# Patient Record
Sex: Male | Born: 1990 | Race: Black or African American | Hispanic: No | Marital: Single | Smoking: Never smoker
Health system: Southern US, Community
[De-identification: ages and names within clinical notes are randomized; demographics above are authoritative.]

## PROBLEM LIST (undated history)

## (undated) DIAGNOSIS — J45909 Unspecified asthma, uncomplicated: Secondary | ICD-10-CM

## (undated) DIAGNOSIS — B019 Varicella without complication: Secondary | ICD-10-CM

## (undated) HISTORY — PX: ANKLE SURGERY: SHX546

---
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
## 2010-02-22 NOTE — Progress Notes (Signed)
Summary: triage  Phone Note Call from Patient Call back at [phone]   Caller: mom-Lucinda Patin Summary of Call: pain when he opens his mouth on the right side to his ear  ask for Ms Brandel the [employer] Teacher Initial call taken by: De Nurse,  January 23, 2009 12:11 PM  Follow-up for Phone Call        left message Follow-up by: Golden Circle RN,  January 23, 2009 2:01 PM  Additional Follow-up for Phone Call Additional follow up Details #1::        spoke with mom. this has been happening x 1 wk. appt today at 3:30 with Dr. Clotilde Dieter. aware his pcp is not here today Additional Follow-up by: Golden Circle RN,  January 24, 2009 10:01 AM

## 2010-02-22 NOTE — Assessment & Plan Note (Signed)
Summary: ear pain/jaw pain   Vital Signs:  Patient profile:   20 year old male Height:      68.5 inches Weight:      158 pounds BMI:     23.76 BSA:     1.86 Temp:     99.0 degrees F Pulse rate:   51 / minute BP sitting:   127 / 79  Vitals Entered By: Jone Baseman CMA (January 24, 2009 4:03 PM) CC: right ear and jaw pain x 1 week Is Patient Diabetic? No Pain Assessment Patient in pain? yes     Location: right ear and jaw Intensity: 8 Type: sharp,shooting   Primary Care Provider:  Romero Belling MD  CC:  right ear and jaw pain x 1 week.  History of Present Illness: Pt has had Rt jaw and ear pain for 1 week. He has not had any loss of hearing. He can open his mouth but has pain with chewing hard things. He says it is a shocking and stabbing pain. He does not have a jaw click. He is not having a fever or any other cold symtpons.   Habits & Providers  Alcohol-Tobacco-Diet     Tobacco Status: never  Current Medications (verified): 1)  Cortisporin 3.5-10000-1 Soln (Neomycin-Polymyxin-Hc) .... 4 Drops in Rt Ear 3-4 Times A Day For 10 Days.  Lie With Affected Ear Up For 5 Min After Administration Give One Large Bottle 2)  Ibuprofen 600 Mg Tabs (Ibuprofen) .... Take One Pill Every 6-8 Hours For Jaw Pain.  Review of Systems        vitals reviewed and pertinent negatives and positives seen in HPI   Physical Exam  General:  Well-developed,well-nourished,in no acute distress; alert,appropriate and cooperative throughout examination Head:  Normocephalic and atraumatic without obvious abnormalities. No apparent alopecia or balding. Ears:  Rt ear canal is erythematous, Lt ear canal is obscured by cerumen.  Mouth:  Oral mucosa and oropharynx without lesions or exudates.  Teeth in good repair. Some tenderness over Rt jaw when opening.  Neck:  No deformities, masses, or tenderness noted. Msk:  Rt TMJ no joint swelling, no joint warmth, and joint tenderness.     Impression &  Recommendations:  Problem # 1:  JAW PAIN (ICD-526.9) Assessment New Pt likely has Rt otits externa and pt was given Rx for Corticsporin Otic and Ibuprofen 600mg . He was instructed to see his dentist if his jaw pain does not improve in 2 weeks.   Orders: [facility]- Est Level  3 (09811)  Complete Medication List: 1)  Cortisporin 3.5-10000-1 Soln (Neomycin-polymyxin-hc) .... 4 drops in rt ear 3-4 times a day for 10 days.  lie with affected ear up for 5 min after administration give one large bottle 2)  Ibuprofen 600 Mg Tabs (Ibuprofen) .... Take one pill every 6-8 hours for jaw pain.  Patient Instructions: 1)  It was nice to see you today. 2)  Use this antibiotic for 10 days and use the Ibuprofen as well. If you are still having pain in 2 weeks go to see your dentist.  Prescriptions: IBUPROFEN 600 MG TABS (IBUPROFEN) take one pill every 6-8 hours for jaw pain.  #12- x 0   Entered and Authorized by:   Jamie Brookes MD   Signed by:   Jamie Brookes MD on 01/24/2009   Method used:   Print then Give to Patient   RxID:   [id number] CORTISPORIN 3.5-10000-1 SOLN (NEOMYCIN-POLYMYXIN-HC) 4 drops in Rt ear 3-4 times a  day for 10 days.  lie with affected ear up for 5 min after administration Give one large bottle  #1 x 1   Entered and Authorized by:   Jamie Brookes MD   Signed by:   Jamie Brookes MD on 01/24/2009   Method used:   Print then Give to Patient   RxID:   [id number]

## 2012-01-28 ENCOUNTER — Emergency Department (HOSPITAL_BASED_OUTPATIENT_CLINIC_OR_DEPARTMENT_OTHER)
Admission: EM | Admit: 2012-01-28 | Discharge: 2012-01-29 | Disposition: A | Discharge status: Home / Self Care | Payer: BC Managed Care – PPO | Attending: Emergency Medicine | Admitting: Emergency Medicine

## 2012-01-28 ENCOUNTER — Encounter (HOSPITAL_BASED_OUTPATIENT_CLINIC_OR_DEPARTMENT_OTHER): Payer: Self-pay | Admitting: *Deleted

## 2012-01-28 DIAGNOSIS — Y93I9 Activity, other involving external motion: Secondary | ICD-10-CM | POA: Insufficient documentation

## 2012-01-28 DIAGNOSIS — F17200 Nicotine dependence, unspecified, uncomplicated: Secondary | ICD-10-CM | POA: Insufficient documentation

## 2012-01-28 DIAGNOSIS — Y92410 Unspecified street and highway as the place of occurrence of the external cause: Secondary | ICD-10-CM | POA: Insufficient documentation

## 2012-01-28 DIAGNOSIS — M5430 Sciatica, unspecified side: Secondary | ICD-10-CM | POA: Insufficient documentation
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
## 2012-01-29 MED ORDER — HYDROCODONE-ACETAMINOPHEN 5-325 MG PO TABS
ORAL_TABLET | ORAL | Status: AC
  Administered 2012-01-29: 01:00:00
  Filled 2012-01-29: qty 1

## 2012-01-29 NOTE — ED Provider Notes (Signed)
See Epic downtime documentation.  Hanley Seamen, MD 01/29/12 [phone]
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
## 2012-10-23 ENCOUNTER — Encounter (HOSPITAL_BASED_OUTPATIENT_CLINIC_OR_DEPARTMENT_OTHER): Payer: Self-pay | Admitting: Student

## 2012-10-23 ENCOUNTER — Emergency Department (HOSPITAL_BASED_OUTPATIENT_CLINIC_OR_DEPARTMENT_OTHER)
Admission: EM | Admit: 2012-10-23 | Discharge: 2012-10-23 | Disposition: A | Discharge status: Home / Self Care | Payer: BC Managed Care – PPO | Attending: Emergency Medicine | Admitting: Emergency Medicine

## 2012-10-23 DIAGNOSIS — F17200 Nicotine dependence, unspecified, uncomplicated: Secondary | ICD-10-CM | POA: Insufficient documentation

## 2012-10-23 DIAGNOSIS — R079 Chest pain, unspecified: Secondary | ICD-10-CM

## 2012-10-23 DIAGNOSIS — R072 Precordial pain: Secondary | ICD-10-CM | POA: Insufficient documentation

## 2012-10-23 DIAGNOSIS — R55 Syncope and collapse: Secondary | ICD-10-CM | POA: Insufficient documentation

## 2012-10-23 LAB — CBC
HCT: 41.6 % (ref 39.0–52.0)
Platelets: 193 10*3/uL (ref 150–400)
RBC: 4.99 MIL/uL (ref 4.22–5.81)
RDW: 13.4 % (ref 11.5–15.5)
WBC: 3.9 10*3/uL — ABNORMAL LOW (ref 4.0–10.5)

## 2012-10-23 LAB — BASIC METABOLIC PANEL
BUN: 10 mg/dL (ref 6–23)
CO2: 28 mEq/L (ref 19–32)
Chloride: 102 mEq/L (ref 96–112)
Creatinine, Ser: 1 mg/dL (ref 0.50–1.35)
GFR calc Af Amer: >90 mL/min (ref >90)
GFR calc non Af Amer: >90 mL/min (ref >90)
Sodium: 138 mEq/L (ref 135–145)

## 2012-10-23 LAB — D-DIMER, QUANTITATIVE: D-Dimer, Quant: <0.27 ug/mL-FEU (ref 0.00–0.48)

## 2012-10-23 LAB — TROPONIN I: Troponin I: <0.3 ng/mL (ref <0.30)

## 2012-10-23 LAB — GLUCOSE, CAPILLARY: Glucose-Capillary: 93 mg/dL (ref 70–99)

## 2012-10-23 MED ORDER — IBUPROFEN 800 MG PO TABS: 800.0000 mg | ORAL_TABLET | Freq: Three times a day (TID) | ORAL | Status: DC

## 2012-10-23 NOTE — ED Provider Notes (Signed)
CSN: [id number]     Arrival date & time 10/23/12  1146 History   First MD Initiated Contact with Patient 10/23/12 1242     Chief Complaint  Patient presents with  . Chest Pain  . Near Syncope   (Consider location/radiation/quality/duration/timing/severity/associated sxs/prior Treatment) Patient is a 22 y.o. male presenting with chest pain. The history is provided by the patient.  Chest Pain Pain location:  Substernal area Pain quality: pressure   Pain quality: not aching, not burning, not dull and not hot   Pain radiates to:  Does not radiate Pain radiates to the back: no   Pain severity:  Moderate Onset quality:  Sudden Progression:  Worsening Chronicity:  New Context: not breathing   Context comment:  Walking Relieved by:  Nothing Worsened by:  Nothing tried Ineffective treatments:  None tried Associated symptoms: syncope   Associated symptoms: no shortness of breath, not vomiting and no weakness   Associated symptoms comment:  Syncope  Risk factors: male sex and smoking   Risk factors: no aortic disease, no birth control, no coronary artery disease, no diabetes mellitus, no high cholesterol, no immobilization, no Marfan's syndrome, not obese and no prior DVT/PE     History reviewed. No pertinent past medical history. Past Surgical History  Procedure Laterality Date  . Ankle surgery     History reviewed. No pertinent family history. History  Substance Use Topics  . Smoking status: Current Some Day Smoker  . Smokeless tobacco: Not on file  . Alcohol Use: No    Review of Systems  Respiratory: Negative for shortness of breath.   Cardiovascular: Positive for chest pain and syncope.  Gastrointestinal: Negative for vomiting.  Neurological: Negative for weakness.  All other systems reviewed and are negative.    Allergies  Review of patient's allergies indicates no known allergies.  Home Medications   Current Outpatient Rx  Name  Route  Sig  Dispense  Refill  .  ibuprofen (ADVIL,MOTRIN) 600 MG tablet      Take one pill every 6-8 hours for jaw pain          . NEOMYCIN-POLYMYXIN-HC, OTIC, (CORTISPORIN) 1 % SOLN      Use 4 drops in right ear 3-4 times a day for 10 days. Lie with affected ear up for 5 minutes after administration. Dispense one large bottle           BP 163/94  Pulse 87  Temp(Src) 98.2 F (36.8 C) (Oral)  Resp 20  Wt 160 lb (72.576 kg)  BMI 23.97 kg/m2  SpO2 100% Physical Exam  Nursing note and vitals reviewed. Constitutional: He is oriented to person, place, and time. He appears well-developed and well-nourished. No distress.  HENT:  Head: Normocephalic and atraumatic.  Mouth/Throat: No oropharyngeal exudate.  Eyes: EOM are normal. Pupils are equal, round, and reactive to light.  Neck: Normal range of motion. Neck supple.  Cardiovascular: Normal rate and regular rhythm.  Exam reveals no friction rub.   No murmur heard. Pulmonary/Chest: Effort normal and breath sounds normal. No respiratory distress. He has no wheezes. He has no rales.  Abdominal: He exhibits no distension. There is no tenderness. There is no rebound.  Musculoskeletal: Normal range of motion. He exhibits no edema.  Neurological: He is alert and oriented to person, place, and time.  Skin: He is not diaphoretic.    ED Course  Procedures (including critical care time) Labs Review Labs Reviewed  CBC - Abnormal; Notable for the following:  WBC 3.9 (*)    All other components within normal limits  GLUCOSE, CAPILLARY  BASIC METABOLIC PANEL  TROPONIN I  D-DIMER, QUANTITATIVE  TROPONIN I   Imaging Review No results found.   Date: 10/23/2012  Rate: 82  Rhythm: normal sinus rhythm  QRS Axis: normal  Intervals: normal  ST/T Wave abnormalities: normal  Conduction Disutrbances:none  Narrative Interpretation:   Old EKG Reviewed: unchanged    MDM   1. Syncope   2. Chest pain    all chocolate 37M with no prior history presents with syncopal  episode. Also concerned for trembling in his hands that has been going on for 6 months, he thinks it's related to his tramadol (takes for chronic back pain). Stated today had syncopal episode, woke up on the ground with his neighbor waking him up. Prior to the episode has chest pain, SOB. No prior CP or SOB. No family history of early cardiac disease or sudden cardiac death.  Here EKG normal. Exam normal. Will check delta troponin and d-dimer.\ Dimer negative. Troponins negative Will have patient stop tramadol and take motrin in case this is secondary to possible seizure, no seizure activity reported, but tramadol can precipitate seizures. Instructed to f/u with Cards with syncopal episode with preceding chest pain.     Dagmar Hait, MD 10/23/12 [phone]

## 2012-10-23 NOTE — ED Notes (Signed)
MD at bedside. 

## 2012-10-23 NOTE — ED Notes (Signed)
Pt in with c/o trembling in hands and central chest pain today, report neighbor woke him up and reports syncopal incidents intermittently x 6 month

## 2013-02-10 ENCOUNTER — Emergency Department (HOSPITAL_COMMUNITY)
Admission: EM | Admit: 2013-02-10 | Discharge: 2013-02-10 | Disposition: A | Discharge status: Home / Self Care | Payer: BC Managed Care – PPO | Attending: Emergency Medicine | Admitting: Emergency Medicine

## 2013-02-10 ENCOUNTER — Encounter (HOSPITAL_COMMUNITY): Payer: Self-pay | Admitting: Emergency Medicine

## 2013-02-10 DIAGNOSIS — Y929 Unspecified place or not applicable: Secondary | ICD-10-CM | POA: Insufficient documentation

## 2013-02-10 DIAGNOSIS — Z791 Long term (current) use of non-steroidal anti-inflammatories (NSAID): Secondary | ICD-10-CM | POA: Insufficient documentation

## 2013-02-10 DIAGNOSIS — W261XXA Contact with sword or dagger, initial encounter: Secondary | ICD-10-CM

## 2013-02-10 DIAGNOSIS — W260XXA Contact with knife, initial encounter: Secondary | ICD-10-CM | POA: Insufficient documentation

## 2013-02-10 DIAGNOSIS — Y9389 Activity, other specified: Secondary | ICD-10-CM | POA: Insufficient documentation

## 2013-02-10 DIAGNOSIS — S61219A Laceration without foreign body of unspecified finger without damage to nail, initial encounter: Secondary | ICD-10-CM

## 2013-02-10 DIAGNOSIS — S61209A Unspecified open wound of unspecified finger without damage to nail, initial encounter: Secondary | ICD-10-CM | POA: Insufficient documentation

## 2013-02-10 DIAGNOSIS — F17200 Nicotine dependence, unspecified, uncomplicated: Secondary | ICD-10-CM | POA: Insufficient documentation

## 2013-02-10 NOTE — Discharge Instructions (Signed)
Keep your finger elevated. Ice several times a day. Keep clean. See information below.   Tissue Adhesive Wound Care Some cuts, wounds, lacerations, and incisions can be repaired by using tissue adhesive. Tissue adhesive is like glue. It holds the skin together, allowing for faster healing. It forms a strong bond on the skin in about 1 minute and reaches its full strength in about 2 or 3 minutes. The adhesive disappears naturally while the wound is healing. It is important to take proper care of your wound at home while it heals.  HOME CARE INSTRUCTIONS   Showers are allowed. Do not soak the area containing the tissue adhesive. Do not take baths, swim, or use hot tubs. Do not use any soaps or ointments on the wound. Certain ointments can weaken the glue.  If a bandage (dressing) has been applied, follow your health care provider's instructions for how often to change the dressing.   Keep the dressing dry if one has been applied.   Do not scratch, pick, or rub the adhesive.   Do not place tape over the adhesive. The adhesive could come off when pulling the tape off.   Protect the wound from further injury until it is healed.   Protect the wound from sun and tanning bed exposure while it is healing and for several weeks after healing.   Only take over-the-counter or prescription medicines as directed by your health care provider.   Keep all follow-up appointments as directed by your health care provider. SEEK IMMEDIATE MEDICAL CARE IF:   Your wound becomes red, swollen, hot, or tender.   You develop a rash after the glue is applied.  You have increasing pain in the wound.   You have a red streak that goes away from the wound.   You have pus coming from the wound.   You have increased bleeding.  You have a fever.  You have shaking chills.   You notice a bad smell coming from the wound.   Your wound or adhesive breaks open.  MAKE SURE YOU:   Understand these  instructions.  Will watch your condition.  Will get help right away if you are not doing well or get worse. Document Released: 07/03/2000 Document Revised: 10/28/2012 Document Reviewed: 07/29/2012 [facility] LLPExit[facility] Patient Information 2014 [employer], [place].

## 2013-02-10 NOTE — ED Notes (Signed)
Pt was cutting chicken and has half inch LAC to left index finger. Bleeding controlled

## 2013-02-10 NOTE — ED Provider Notes (Signed)
CSN: [id number]     Arrival date & time 02/10/13  2107 History   None    This chart was scribed for non-physician practitioner, Jaynie Crumble, PA-C, working with Leonette Most B. Bernette Mayers, MD by Arlan Organ, ED Scribe. This patient was seen in room TR09C/TR09C and the patient's care was started at 11:05 PM.   Chief Complaint  Patient presents with  . Laceration   The history is provided by the patient. No language interpreter was used.    HPI Comments: Billy Parrish is a 23 y.o. male who presents to the Emergency Department complaining of a laceration to the left 2nd digit sustained after cutting some chicken with a sharp knife today around 8:30 PM. He reports perfused bleeding at the time of onset. He states he has noted numbness to the tip of his left index finger, but denies any other pain or discomfort. He denies any aggravating or alleviating factors. Denies fever or chills. He states his tetanus is UTD. Pt has no other pertinent medical history, and denies any other complaints at this time.  History reviewed. No pertinent past medical history. Past Surgical History  Procedure Laterality Date  . Ankle surgery     History reviewed. No pertinent family history. History  Substance Use Topics  . Smoking status: Current Some Day Smoker  . Smokeless tobacco: Never Used  . Alcohol Use: No    Review of Systems  Constitutional: Negative for fever and chills.  Skin: Positive for wound (laceration to left index finger).    Allergies  Review of patient's allergies indicates no known allergies.  Home Medications   Current Outpatient Rx  Name  Route  Sig  Dispense  Refill  . ibuprofen (ADVIL,MOTRIN) 600 MG tablet      Take one pill every 6-8 hours for jaw pain          . ibuprofen (ADVIL,MOTRIN) 800 MG tablet   Oral   Take 1 tablet (800 mg total) by mouth 3 (three) times daily.   21 tablet   0   . NEOMYCIN-POLYMYXIN-HC, OTIC, (CORTISPORIN) 1 % SOLN      Use 4 drops in  right ear 3-4 times a day for 10 days. Lie with affected ear up for 5 minutes after administration. Dispense one large bottle           Triage Vitals: BP 153/100  Pulse 87  Temp(Src) 98.8 F (37.1 C) (Oral)  Resp 18  Physical Exam  Nursing note and vitals reviewed. Constitutional: He is oriented to person, place, and time. He appears well-developed and well-nourished.  HENT:  Head: Normocephalic and atraumatic.  Eyes: EOM are normal.  Neck: Normal range of motion.  Cardiovascular: Normal rate and regular rhythm.   Pulmonary/Chest: Effort normal.  Musculoskeletal: Normal range of motion.  Neurological: He is alert and oriented to person, place, and time.  Skin: Skin is warm and dry.  2 cm hemastatic flap laceration to the tip of the left index finger  Psychiatric: He has a normal mood and affect. His behavior is normal.    ED Course  Procedures (including critical care time)    COORDINATION OF CARE: 11:05 PM- Will clean the area. Will use Derma Bond to close wound. Discussed treatment plan with pt at bedside and pt agreed to plan.     Labs Review Labs Reviewed - No data to display Imaging Review No results found.  EKG Interpretation   None       MDM  1. Laceration of finger     Patient is a very small laceration to the tip of the finger. It is a flap laceration does not appear to be deep. Patient's finger was irrigated with saline. I do not think he needs stitches. Wound repaired with Dermabond. Patient tolerated procedure well. Last tetanus shot was 2 years ago. Patient will go home with ibuprofen for pain, careful wound care, followup as needed.  I personally performed the services described in this documentation, which was scribed in my presence. The recorded information has been reviewed and is accurate.   Lottie Musselatyana A Krisinda Giovanni, PA-C 02/10/13 2345

## 2013-02-11 NOTE — ED Provider Notes (Signed)
Medical screening examination/treatment/procedure(s) were performed by non-physician practitioner and as supervising physician I was immediately available for consultation/collaboration.  EKG Interpretation   None         Shawanda Sievert B. Bernette MayersSheldon, MD 02/11/13 [phone]

## 2013-11-24 ENCOUNTER — Emergency Department (HOSPITAL_COMMUNITY)
Admission: EM | Admit: 2013-11-24 | Discharge: 2013-11-24 | Disposition: A | Discharge status: Home / Self Care | Payer: BC Managed Care – PPO | Source: Home / Self Care | Attending: Emergency Medicine | Admitting: Emergency Medicine

## 2013-11-24 ENCOUNTER — Ambulatory Visit (HOSPITAL_COMMUNITY): Payer: BC Managed Care – PPO | Attending: Emergency Medicine

## 2013-11-24 ENCOUNTER — Encounter (HOSPITAL_COMMUNITY): Payer: Self-pay

## 2013-11-24 DIAGNOSIS — Z720 Tobacco use: Secondary | ICD-10-CM | POA: Insufficient documentation

## 2013-11-24 DIAGNOSIS — R05 Cough: Secondary | ICD-10-CM | POA: Insufficient documentation

## 2013-11-24 DIAGNOSIS — J111 Influenza due to unidentified influenza virus with other respiratory manifestations: Secondary | ICD-10-CM

## 2013-11-24 DIAGNOSIS — R0602 Shortness of breath: Secondary | ICD-10-CM | POA: Insufficient documentation

## 2013-11-24 DIAGNOSIS — R69 Illness, unspecified: Principal | ICD-10-CM

## 2013-11-24 DIAGNOSIS — R059 Cough, unspecified: Secondary | ICD-10-CM

## 2013-11-24 LAB — POCT RAPID STREP A: Streptococcus, Group A Screen (Direct): NEGATIVE

## 2013-11-24 MED ORDER — ALBUTEROL SULFATE (2.5 MG/3ML) 0.083% IN NEBU
2.5000 mg | INHALATION_SOLUTION | Freq: Once | RESPIRATORY_TRACT | Status: AC
  Administered 2013-11-24: 2.5 mg via RESPIRATORY_TRACT

## 2013-11-24 MED ORDER — IPRATROPIUM BROMIDE 0.02 % IN SOLN
RESPIRATORY_TRACT | Status: AC
  Filled 2013-11-24: qty 2.5

## 2013-11-24 MED ORDER — IPRATROPIUM-ALBUTEROL 0.5-2.5 (3) MG/3ML IN SOLN
3.0000 mL | Freq: Once | RESPIRATORY_TRACT | Status: AC
  Administered 2013-11-24: 3 mL via RESPIRATORY_TRACT

## 2013-11-24 MED ORDER — PREDNISONE 20 MG PO TABS
ORAL_TABLET | ORAL | Status: AC
  Filled 2013-11-24: qty 3

## 2013-11-24 MED ORDER — ALBUTEROL SULFATE (2.5 MG/3ML) 0.083% IN NEBU
INHALATION_SOLUTION | RESPIRATORY_TRACT | Status: AC
  Filled 2013-11-24: qty 3

## 2013-11-24 MED ORDER — ALBUTEROL SULFATE HFA 108 (90 BASE) MCG/ACT IN AERS: 2.0000 | INHALATION_SPRAY | RESPIRATORY_TRACT | Status: DC | PRN

## 2013-11-24 MED ORDER — OSELTAMIVIR PHOSPHATE 75 MG PO CAPS: 75.0000 mg | ORAL_CAPSULE | Freq: Two times a day (BID) | ORAL | Status: DC

## 2013-11-24 MED ORDER — HYDROCODONE-ACETAMINOPHEN 5-325 MG PO TABS: ORAL_TABLET | ORAL | Status: DC

## 2013-11-24 MED ORDER — PREDNISONE 20 MG PO TABS: ORAL_TABLET | ORAL | Status: DC

## 2013-11-24 MED ORDER — PREDNISONE 20 MG PO TABS
60.0000 mg | ORAL_TABLET | Freq: Once | ORAL | Status: AC
  Administered 2013-11-24: 60 mg via ORAL

## 2013-11-24 MED ORDER — HYDROCOD POLST-CHLORPHEN POLST 10-8 MG/5ML PO LQCR: 5.0000 mL | Freq: Two times a day (BID) | ORAL | Status: DC | PRN

## 2013-11-24 NOTE — ED Provider Notes (Signed)
Chief Complaint   URI   History of Present Illness   Billy Parrish is a 23 year old male with a history of exercise-induced asthma who has had a four-day history of sore throat, nasal congestion with white drainage, headache, sinus pressure, left ear pain, burning in his eyes, temperature to 102, myalgias, and back pain. The patient states it hurts when he breathes, he's had cough productive of white sputum and tightness in chest. He has not gotten a flu vaccine this year. He states he's tried tramadol at home, naproxen, Aleve, Robitussin, NyQuil without relief.  Review of Systems   Other than as noted above, the patient denies any of the following symptoms: Systemic:  No fevers, chills, sweats, or myalgias. Eye:  No redness or discharge. ENT:  No ear pain, headache, nasal congestion, drainage, sinus pressure, or sore throat. Neck:  No neck pain, stiffness, or swollen glands. Lungs:  No cough, sputum production, hemoptysis, wheezing, chest tightness, shortness of breath or chest pain. GI:  No abdominal pain, nausea, vomiting or diarrhea.  PMFSH   Past medical history, family history, social history, meds, and allergies were reviewed.   Physical exam   Vital signs:  There were no vitals taken for this visit. General:  Alert and oriented.  In no distress.  Skin warm and dry. Eye:  No conjunctival injection or drainage. Lids were normal. ENT:  TMs and canals were normal, without erythema or inflammation.  Nasal mucosa was clear and uncongested, without drainage.  Mucous membranes were moist.  Pharynx was clear with no exudate or drainage.  There were no oral ulcerations or lesions. Neck:  Supple, no adenopathy, tenderness or mass. Lungs:  No respiratory distress.  He has bilateral inspiratory rhonchi, no rales or wheezes. Good air movement bilaterally. No respiratory distress.  Heart:  Regular rhythm, without gallops, murmers or rubs. Skin:  Clear, warm, and dry, without rash or  lesions.  Labs   Results for orders placed or performed during the hospital encounter of 11/24/13  POCT rapid strep [facility]  Result Value Ref Range   Streptococcus, Group A Screen (Direct) NEGATIVE NEGATIVE     Radiology   Dg Chest 2 View  11/24/2013   CLINICAL DATA:  Three-day history of cough and shortness of breath; current tobacco use  EXAM: CHEST  2 VIEW  COMPARISON:  None.  FINDINGS: The lungs are adequately inflated. There is no focal infiltrate, pleural effusion, or pneumothorax. The mediastinum is normal in width. The heart and pulmonary vascularity are normal. The observed bony thorax is unremarkable.  IMPRESSION: There is no active cardiopulmonary disease.   Electronically Signed   By: Benjamyn Hestand  [place]   On: 11/24/2013 12:27     Course in Urgent Care Center   The following medications were given:  Medications  albuterol (PROVENTIL) (2.5 MG/3ML) 0.083% nebulizer solution 2.5 mg (2.5 mg Nebulization Given 11/24/13 1059)  ipratropium-albuterol (DUONEB) 0.5-2.5 (3) MG/3ML nebulizer solution 3 mL (3 mLs Nebulization Given 11/24/13 1059)  predniSONE (DELTASONE) tablet 60 mg (60 mg Oral Given 11/24/13 1059)    After the above treatments, the patient states he felt some better, he still had bilateral inspiratory rales, without wheezes or rales. He felt like he could go home at that point.  Assessment     The primary encounter diagnosis was Influenza-like illness. A diagnosis of Cough was also pertinent to this visit.  There is no evidence of pneumonia, strep throat, sinusitis, otitis media.    Plan  1.  Meds:  The following meds were prescribed:   Discharge Medication List as of 11/24/2013 12:50 PM    START taking these medications   Details  albuterol (PROVENTIL HFA;VENTOLIN HFA) 108 (90 BASE) MCG/ACT inhaler Inhale 2 puffs into the lungs every 4 (four) hours as needed for wheezing or shortness of breath., Starting 11/24/2013, Until Discontinued, Normal     chlorpheniramine-HYDROcodone (TUSSIONEX) 10-8 MG/5ML LQCR Take 5 mLs by mouth every 12 (twelve) hours as needed for cough., Starting 11/24/2013, Until Discontinued, Normal    HYDROcodone-acetaminophen (NORCO/VICODIN) 5-325 MG per tablet 1 to 2 tabs every 4 to 6 hours as needed for pain., Print    oseltamivir (TAMIFLU) 75 MG capsule Take 1 capsule (75 mg total) by mouth every 12 (twelve) hours., Starting 11/24/2013, Until Discontinued, Normal    predniSONE (DELTASONE) 20 MG tablet Take 3 daily for 5 days, 2 daily for 5 days, 1 daily for 5 days., Normal        2.  Patient Education/Counseling:  The patient was given appropriate handouts, self care instructions, and instructed in symptomatic relief.  Instructed to get extra fluids and extra rest.    3.  Follow up:  The patient was told to follow up here if no better in 3 to 4 days, or sooner if becoming worse in any way, and given some red flag symptoms such as increasing fever, difficulty breathing, chest pain, or persistent vomiting which would prompt immediate return.       Reuben Likesavid C Shericka Johnstone, MD 11/24/13 1540

## 2013-11-24 NOTE — Discharge Instructions (Signed)
Asthma Attack Prevention Although there is no way to prevent asthma from starting, you can take steps to control the disease and reduce its symptoms. Learn about your asthma and how to control it. Take an active role to control your asthma by working with your health care provider to create and follow an asthma action plan. An asthma action plan guides you in:  Taking your medicines properly.  Avoiding things that set off your asthma or make your asthma worse (asthma triggers).  Tracking your level of asthma control.  Responding to worsening asthma.  Seeking emergency care when needed. To track your asthma, keep records of your symptoms, check your peak flow number using a handheld device that shows how well air moves out of your lungs (peak flow meter), and get regular asthma checkups.  WHAT ARE SOME WAYS TO PREVENT AN ASTHMA ATTACK?  Take medicines as directed by your health care provider.  Keep track of your asthma symptoms and level of control.  With your health care provider, write a detailed plan for taking medicines and managing an asthma attack. Then be sure to follow your action plan. Asthma is an ongoing condition that needs regular monitoring and treatment.  Identify and avoid asthma triggers. Many outdoor allergens and irritants (such as pollen, mold, cold air, and air pollution) can trigger asthma attacks. Find out what your asthma triggers are and take steps to avoid them.  Monitor your breathing. Learn to recognize warning signs of an attack, such as coughing, wheezing, or shortness of breath. Your lung function may decrease before you notice any signs or symptoms, so regularly measure and record your peak airflow with a home peak flow meter.  Identify and treat attacks early. If you act quickly, you are less likely to have a severe attack. You will also need less medicine to control your symptoms. When your peak flow measurements decrease and alert you to an upcoming attack,  take your medicine as instructed and immediately stop any activity that may have triggered the attack. If your symptoms do not improve, get medical help.  Pay attention to increasing quick-relief inhaler use. If you find yourself relying on your quick-relief inhaler, your asthma is not under control. See your health care provider about adjusting your treatment. WHAT CAN MAKE MY SYMPTOMS WORSE? A number of common things can set off or make your asthma symptoms worse and cause temporary increased inflammation of your airways. Keep track of your asthma symptoms for several weeks, detailing all the environmental and emotional factors that are linked with your asthma. When you have an asthma attack, go back to your asthma diary to see which factor, or combination of factors, might have contributed to it. Once you know what these factors are, you can take steps to control many of them. If you have allergies and asthma, it is important to take asthma prevention steps at home. Minimizing contact with the substance to which you are allergic will help prevent an asthma attack. Some triggers and ways to avoid these triggers are: Animal Dander:  Some people are allergic to the flakes of skin or dried saliva from animals with fur or feathers.   There is no such thing as a hypoallergenic dog or cat breed. All dogs or cats can cause allergies, even if they don't shed.  Keep these pets out of your home.  If you are not able to keep a pet outdoors, keep the pet out of your bedroom and other sleeping areas at all  times, and keep the door closed. °· Remove carpets and furniture covered with cloth from your home. If that is not possible, keep the pet away from fabric-covered furniture and carpets. °Dust Mites: °Many people with asthma are allergic to dust mites. Dust mites are tiny bugs that are found in every home in mattresses, pillows, carpets, fabric-covered furniture, bedcovers, clothes, stuffed toys, and other  fabric-covered items.  °· Cover your mattress in a special dust-proof cover. °· Cover your pillow in a special dust-proof cover, or wash the pillow each week in hot water. Water must be hotter than 130° F (54.4° C) to kill dust mites. Cold or warm water used with detergent and bleach can also be effective. °· Wash the sheets and blankets on your bed each week in hot water. °· Try not to sleep or lie on cloth-covered cushions. °· Call ahead when traveling and ask for a smoke-free hotel room. Bring your own bedding and pillows in case the hotel only supplies feather pillows and down comforters, which may contain dust mites and cause asthma symptoms. °· Remove carpets from your bedroom and those laid on concrete, if you can. °· Keep stuffed toys out of the bed, or wash the toys weekly in hot water or cooler water with detergent and bleach. °Cockroaches: °Many people with asthma are allergic to the droppings and remains of cockroaches.  °· Keep food and garbage in closed containers. Never leave food out. °· Use poison baits, traps, powders, gels, or paste (for example, boric acid). °· If a spray is used to kill cockroaches, stay out of the room until the odor goes away. °Indoor Mold: °· Fix leaky faucets, pipes, or other sources of water that have mold around them. °· Clean floors and moldy surfaces with a fungicide or diluted bleach. °· Avoid using humidifiers, vaporizers, or swamp coolers. These can spread molds through the air. °Pollen and Outdoor Mold: °· When pollen or mold spore counts are high, try to keep your windows closed. °· Stay indoors with windows closed from late morning to afternoon. Pollen and some mold spore counts are highest at that time. °· Ask your health care provider whether you need to take anti-inflammatory medicine or increase your dose of the medicine before your allergy season starts. °Other Irritants to Avoid: °· Tobacco smoke is an irritant. If you smoke, ask your health care provider how  you can quit. Ask family members to quit smoking, too. Do not allow smoking in your home or car. °· If possible, do not use a wood-burning stove, kerosene heater, or fireplace. Minimize exposure to all sources of smoke, including incense, candles, fires, and fireworks. °· Try to stay away from strong odors and sprays, such as perfume, talcum powder, hair spray, and paints. °· Decrease humidity in your home and use an indoor air cleaning device. Reduce indoor humidity to below 60%. Dehumidifiers or central air conditioners can do this. °· Decrease house dust exposure by changing furnace and air cooler filters frequently. °· Try to have someone else vacuum for you once or twice a week. Stay out of rooms while they are being vacuumed and for a short while afterward. °· If you vacuum, use a dust mask from a hardware store, a double-layered or microfilter vacuum cleaner bag, or a vacuum cleaner with a HEPA filter. °· Sulfites in foods and beverages can be irritants. Do not drink beer or wine or eat dried fruit, processed potatoes, or shrimp if they cause asthma symptoms. °· Cold   air can trigger an asthma attack. Cover your nose and mouth with a scarf on cold or windy days.  Several health conditions can make asthma more difficult to manage, including a runny nose, sinus infections, reflux disease, psychological stress, and sleep apnea. Work with your health care provider to manage these conditions.  Avoid close contact with people who have a respiratory infection such as a cold or the flu, since your asthma symptoms may get worse if you catch the infection. Wash your hands thoroughly after touching items that may have been handled by people with a respiratory infection.  Get a flu shot every year to protect against the flu virus, which often makes asthma worse for days or weeks. Also get a pneumonia shot if you have not previously had one. Unlike the flu shot, the pneumonia shot does not need to be given  yearly. Medicines:  Talk to your health care provider about whether it is safe for you to take aspirin or non-steroidal anti-inflammatory medicines (NSAIDs). In a small number of people with asthma, aspirin and NSAIDs can cause asthma attacks. These medicines must be avoided by people who have known aspirin-sensitive asthma. It is important that people with aspirin-sensitive asthma read labels of all over-the-counter medicines used to treat pain, colds, coughs, and fever.  Beta-blockers and ACE inhibitors are other medicines you should discuss with your health care provider. HOW CAN I FIND OUT WHAT I AM ALLERGIC TO? Ask your asthma health care provider about allergy skin testing or blood testing (the RAST test) to identify the allergens to which you are sensitive. If you are found to have allergies, the most important thing to do is to try to avoid exposure to any allergens that you are sensitive to as much as possible. Other treatments for allergies, such as medicines and allergy shots (immunotherapy) are available.  CAN I EXERCISE? Follow your health care provider's advice regarding asthma treatment before exercising. It is important to maintain a regular exercise program, but vigorous exercise or exercise in cold, humid, or dry environments can cause asthma attacks, especially for those people who have exercise-induced asthma. Document Released: 12/26/2008 Document Revised: 01/12/2013 Document Reviewed: 07/15/2012 [facility] Patient Information 2015 [place], [place]. This information is not intended to replace advice given to you by your health care provider. Make sure you discuss any questions you have with your health care provider.  Influenza Influenza ("the flu") is a viral infection of the respiratory tract. It occurs more often in winter months because people spend more time in close contact with one another. Influenza can make you feel very sick. Influenza easily spreads from person to person  (contagious). CAUSES  Influenza is caused by a virus that infects the respiratory tract. You can catch the virus by breathing in droplets from an infected person's cough or sneeze. You can also catch the virus by touching something that was recently contaminated with the virus and then touching your mouth, nose, or eyes. RISKS AND COMPLICATIONS You may be at risk for a more severe case of influenza if you smoke cigarettes, have diabetes, have chronic heart disease (such as heart failure) or lung disease (such as asthma), or if you have a weakened immune system. Elderly people and pregnant women are also at risk for more serious infections. The most common problem of influenza is a lung infection (pneumonia). Sometimes, this problem can require emergency medical care and may be life threatening. SIGNS AND SYMPTOMS  Symptoms typically last 4 to 10 days and  may include:  Fever.  Chills.  Headache, body aches, and muscle aches.  Sore throat.  Chest discomfort and cough.  Poor appetite.  Weakness or feeling tired.  Dizziness.  Nausea or vomiting. DIAGNOSIS  Diagnosis of influenza is often made based on your history and a physical exam. A nose or throat swab test can be done to confirm the diagnosis. TREATMENT  In mild cases, influenza goes away on its own. Treatment is directed at relieving symptoms. For more severe cases, your health care provider may prescribe antiviral medicines to shorten the sickness. Antibiotic medicines are not effective because the infection is caused by a virus, not by bacteria. HOME CARE INSTRUCTIONS  Take medicines only as directed by your health care provider.  Use a cool mist humidifier to make breathing easier.  Get plenty of rest until your temperature returns to normal. This usually takes 3 to 4 days.  Drink enough fluid to keep your urine clear or pale yellow.  Cover yourmouth and nosewhen coughing or sneezing,and wash your handswellto prevent  thevirusfrom spreading.  Stay homefromwork orschool untilthe fever is gonefor at least 341full day. PREVENTION  An annual influenza vaccination (flu shot) is the best way to avoid getting influenza. An annual flu shot is now routinely recommended for all adults in the [place] SEEK MEDICAL CARE IF:  You experiencechest pain, yourcough worsens,or you producemore mucus.  Youhave nausea,vomiting, ordiarrhea.  Your fever returns or gets worse. SEEK IMMEDIATE MEDICAL CARE IF:  You havetrouble breathing, you become short of breath,or your skin ornails becomebluish.  You have severe painor stiffnessin the neck.  You develop a sudden headache, or pain in the face or ear.  You have nausea or vomiting that you cannot control. MAKE SURE YOU:   Understand these instructions.  Will watch your condition.  Will get help right away if you are not doing well or get worse. Document Released: 01/05/2000 Document Revised: 05/24/2013 Document Reviewed: 04/08/2011 [facility] Patient Information 2015 [place], [place]. This information is not intended to replace advice given to you by your health care provider. Make sure you discuss any questions you have with your health care provider.

## 2013-11-24 NOTE — ED Notes (Signed)
To radiology for chest films

## 2013-11-24 NOTE — ED Notes (Signed)
C/o onset 11-1 of cough, congestion right sided HA, ST. Minimal relief w OTC medications . NAD

## 2013-11-26 LAB — CULTURE, GROUP A STREP

## 2014-02-27 ENCOUNTER — Encounter (HOSPITAL_BASED_OUTPATIENT_CLINIC_OR_DEPARTMENT_OTHER): Payer: Self-pay | Admitting: *Deleted

## 2014-02-27 ENCOUNTER — Emergency Department (HOSPITAL_BASED_OUTPATIENT_CLINIC_OR_DEPARTMENT_OTHER)
Admission: EM | Admit: 2014-02-27 | Discharge: 2014-02-27 | Disposition: A | Discharge status: Home / Self Care | Payer: BC Managed Care – PPO | Attending: Emergency Medicine | Admitting: Emergency Medicine

## 2014-02-27 ENCOUNTER — Emergency Department (HOSPITAL_BASED_OUTPATIENT_CLINIC_OR_DEPARTMENT_OTHER): Payer: BC Managed Care – PPO

## 2014-02-27 DIAGNOSIS — S63615A Unspecified sprain of left ring finger, initial encounter: Secondary | ICD-10-CM | POA: Insufficient documentation

## 2014-02-27 DIAGNOSIS — Z720 Tobacco use: Secondary | ICD-10-CM | POA: Insufficient documentation

## 2014-02-27 DIAGNOSIS — S39012A Strain of muscle, fascia and tendon of lower back, initial encounter: Secondary | ICD-10-CM | POA: Diagnosis not present

## 2014-02-27 DIAGNOSIS — Y92410 Unspecified street and highway as the place of occurrence of the external cause: Secondary | ICD-10-CM | POA: Diagnosis not present

## 2014-02-27 DIAGNOSIS — Y9389 Activity, other specified: Secondary | ICD-10-CM | POA: Insufficient documentation

## 2014-02-27 DIAGNOSIS — Z7952 Long term (current) use of systemic steroids: Secondary | ICD-10-CM | POA: Insufficient documentation

## 2014-02-27 DIAGNOSIS — S3992XA Unspecified injury of lower back, initial encounter: Secondary | ICD-10-CM | POA: Diagnosis present

## 2014-02-27 DIAGNOSIS — Y998 Other external cause status: Secondary | ICD-10-CM | POA: Diagnosis not present
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
## 2014-02-27 NOTE — ED Notes (Signed)
Pt in xray

## 2014-02-27 NOTE — ED Notes (Signed)
Back from xray, rapid steady gait, NAD, calm, interactive.

## 2014-02-27 NOTE — Discharge Instructions (Signed)
Back Pain, Adult °Low back pain is very common. About 1 in 5 people have back pain. The cause of low back pain is rarely dangerous. The pain often gets better over time. About half of people with a sudden onset of back pain feel better in just 2 weeks. About 8 in 10 people feel better by 6 weeks.  °CAUSES °Some common causes of back pain include: °· Strain of the muscles or ligaments supporting the spine. °· Wear and tear (degeneration) of the spinal discs. °· Arthritis. °· Direct injury to the back. °DIAGNOSIS °Most of the time, the direct cause of low back pain is not known. However, back pain can be treated effectively even when the exact cause of the pain is unknown. Answering your caregiver's questions about your overall health and symptoms is one of the most accurate ways to make sure the cause of your pain is not dangerous. If your caregiver needs more information, he or she may order lab work or imaging tests (X-rays or MRIs). However, even if imaging tests show changes in your back, this usually does not require surgery. °HOME CARE INSTRUCTIONS °For many people, back pain returns. Since low back pain is rarely dangerous, it is often a condition that people can learn to manage on their own.  °· Remain active. It is stressful on the back to sit or stand in one place. Do not sit, drive, or stand in one place for more than 30 minutes at a time. Take short walks on level surfaces as soon as pain allows. Try to increase the length of time you walk each day. °· Do not stay in bed. Resting more than 1 or 2 days can delay your recovery. °· Do not avoid exercise or work. Your body is made to move. It is not dangerous to be active, even though your back may hurt. Your back will likely heal faster if you return to being active before your pain is gone. °· Pay attention to your body when you  bend and lift. Many people have less discomfort when lifting if they bend their knees, keep the load close to their bodies, and  avoid twisting. Often, the most comfortable positions are those that put less stress on your recovering back. °· Find a comfortable position to sleep. Use a firm mattress and lie on your side with your knees slightly bent. If you lie on your back, put a pillow under your knees. °· Only take over-the-counter or prescription medicines as directed by your caregiver. Over-the-counter medicines to reduce pain and inflammation are often the most helpful. Your caregiver may prescribe muscle relaxant drugs. These medicines help dull your pain so you can more quickly return to your normal activities and healthy exercise. °· Put ice on the injured area. °¨ Put ice in a plastic bag. °¨ Place a towel between your skin and the bag. °¨ Leave the ice on for 15-20 minutes, 03-04 times a day for the first 2 to 3 days. After that, ice and heat may be alternated to reduce pain and spasms. °· Ask your caregiver about trying back exercises and gentle massage. This may be of some benefit. °· Avoid feeling anxious or stressed. Stress increases muscle tension and can worsen back pain. It is important to recognize when you are anxious or stressed and learn ways to manage it. Exercise is a great option. °SEEK MEDICAL CARE IF: °· You have pain that is not relieved with rest or medicine. °· You have pain that does not improve in 1 week. °· You have new symptoms. °· You are generally not feeling well. °SEEK   IMMEDIATE MEDICAL CARE IF:   You have pain that radiates from your back into your legs.  You develop new bowel or bladder control problems.  You have unusual weakness or numbness in your arms or legs.  You develop nausea or vomiting.  You develop abdominal pain.  You feel faint. Document Released: 01/07/2005 Document Revised: 07/09/2011 Document Reviewed: 05/11/2013 [facility]Exit[facility] Patient Information 2015 [place], [place]. This information is not intended to replace advice given to you by your health care provider. Make sure you  discuss any questions you have with your health care provider.   Finger Sprain A finger sprain is a tear in one of the strong, fibrous tissues that connect the bones (ligaments) in your finger. The severity of the sprain depends on how much of the ligament is torn. The tear can be either partial or complete. CAUSES  Often, sprains are a result of a fall or accident. If you extend your hands to catch an object or to protect yourself, the force of the impact causes the fibers of your ligament to stretch too much. This excess tension causes the fibers of your ligament to tear. SYMPTOMS  You may have some loss of motion in your finger. Other symptoms include:  Bruising.  Tenderness.  Swelling. DIAGNOSIS  In order to diagnose finger sprain, your caregiver will physically examine your finger or thumb to determine how torn the ligament is. Your caregiver may also suggest an X-ray exam of your finger to make sure no bones are broken. TREATMENT  If your ligament is only partially torn, treatment usually involves keeping the finger in a fixed position (immobilization) for a short period. To do this, your caregiver will apply a bandage, cast, or splint to keep your finger from moving until it heals. For a partially torn ligament, the healing process usually takes 2 to 3 weeks. If your ligament is completely torn, you may need surgery to reconnect the ligament to the bone. After surgery a cast or splint will be applied and will need to stay on your finger or thumb for 4 to 6 weeks while your ligament heals. HOME CARE INSTRUCTIONS  Keep your injured finger elevated, when possible, to decrease swelling.  To ease pain and swelling, apply ice to your joint twice a day, for 2 to 3 days:  Put ice in a plastic bag.  Place a towel between your skin and the bag.  Leave the ice on for 15 minutes.  Only take over-the-counter or prescription medicine for pain as directed by your caregiver.  Do not wear  rings on your injured finger.  Do not leave your finger unprotected until pain and stiffness go away (usually 3 to 4 weeks).  Do not allow your cast or splint to get wet. Cover your cast or splint with a plastic bag when you shower or bathe. Do not swim.  Your caregiver may suggest special exercises for you to do during your recovery to prevent or limit permanent stiffness. SEEK IMMEDIATE MEDICAL CARE IF:  Your cast or splint becomes damaged.  Your pain becomes worse rather than better. MAKE SURE YOU:  Understand these instructions.  Will watch your condition.  Will get help right away if you are not doing well or get worse. Document Released: 02/15/2004 Document Revised: 04/01/2011 Document Reviewed: 09/10/2010 [facility] Patient Information 2015 [place], [place]. This information is not intended to replace advice given to you by your health care provider. Make sure you discuss any questions you have with your health  care provider.  
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
## 2014-11-30 ENCOUNTER — Emergency Department (HOSPITAL_COMMUNITY)
Admission: EM | Admit: 2014-11-30 | Discharge: 2014-11-30 | Disposition: A | Discharge status: Home / Self Care | Payer: BC Managed Care – PPO | Source: Home / Self Care | Attending: Family Medicine | Admitting: Family Medicine

## 2014-11-30 ENCOUNTER — Encounter (HOSPITAL_COMMUNITY): Payer: Self-pay | Admitting: Emergency Medicine

## 2014-11-30 ENCOUNTER — Emergency Department (INDEPENDENT_AMBULATORY_CARE_PROVIDER_SITE_OTHER): Payer: BC Managed Care – PPO

## 2014-11-30 DIAGNOSIS — J4520 Mild intermittent asthma, uncomplicated: Secondary | ICD-10-CM | POA: Diagnosis not present

## 2014-11-30 MED ORDER — DOXYCYCLINE HYCLATE 100 MG PO CAPS: 100.0000 mg | ORAL_CAPSULE | Freq: Two times a day (BID) | ORAL | Status: DC

## 2014-11-30 MED ORDER — ALBUTEROL SULFATE (2.5 MG/3ML) 0.083% IN NEBU
5.0000 mg | INHALATION_SOLUTION | Freq: Once | RESPIRATORY_TRACT | Status: AC
  Administered 2014-11-30: 5 mg via RESPIRATORY_TRACT

## 2014-11-30 MED ORDER — IPRATROPIUM BROMIDE 0.02 % IN SOLN
0.5000 mg | Freq: Once | RESPIRATORY_TRACT | Status: AC
  Administered 2014-11-30: 0.5 mg via RESPIRATORY_TRACT

## 2014-11-30 MED ORDER — IPRATROPIUM BROMIDE 0.02 % IN SOLN
RESPIRATORY_TRACT | Status: AC
  Filled 2014-11-30: qty 2.5

## 2014-11-30 MED ORDER — ALBUTEROL SULFATE HFA 108 (90 BASE) MCG/ACT IN AERS: 2.0000 | INHALATION_SPRAY | Freq: Four times a day (QID) | RESPIRATORY_TRACT | Status: DC | PRN

## 2014-11-30 MED ORDER — METHYLPREDNISOLONE SODIUM SUCC 125 MG IJ SOLR
INTRAMUSCULAR | Status: AC
Start: 2014-11-30 — End: 2014-11-30
  Filled 2014-11-30: qty 2

## 2014-11-30 MED ORDER — ALBUTEROL SULFATE (2.5 MG/3ML) 0.083% IN NEBU
INHALATION_SOLUTION | RESPIRATORY_TRACT | Status: AC
  Filled 2014-11-30: qty 3

## 2014-11-30 MED ORDER — METHYLPREDNISOLONE SODIUM SUCC 125 MG IJ SOLR
125.0000 mg | Freq: Once | INTRAMUSCULAR | Status: AC
  Administered 2014-11-30: 125 mg via INTRAMUSCULAR

## 2014-11-30 NOTE — ED Notes (Signed)
Pt states he has been suffering from wheezing and a cough for 3-4 days.  He had asthma as a child.  He also reports some blood in his sputum.  He denies any fever or any other issues.
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
## 2014-11-30 NOTE — Discharge Instructions (Signed)
Take all of medicine, drink lots of fluids. see your doctor if further problems

## 2015-07-08 ENCOUNTER — Encounter (HOSPITAL_COMMUNITY): Payer: Self-pay | Admitting: Emergency Medicine

## 2015-07-08 ENCOUNTER — Emergency Department (HOSPITAL_COMMUNITY)
Admission: EM | Admit: 2015-07-08 | Discharge: 2015-07-08 | Disposition: A | Discharge status: Home / Self Care | Payer: BC Managed Care – PPO | Attending: Emergency Medicine | Admitting: Emergency Medicine

## 2015-07-08 DIAGNOSIS — Y939 Activity, unspecified: Secondary | ICD-10-CM | POA: Insufficient documentation

## 2015-07-08 DIAGNOSIS — T162XXA Foreign body in left ear, initial encounter: Secondary | ICD-10-CM | POA: Diagnosis present

## 2015-07-08 DIAGNOSIS — Z87891 Personal history of nicotine dependence: Secondary | ICD-10-CM | POA: Diagnosis not present

## 2015-07-08 DIAGNOSIS — W458XXA Other foreign body or object entering through skin, initial encounter: Secondary | ICD-10-CM | POA: Insufficient documentation

## 2015-07-08 DIAGNOSIS — Y929 Unspecified place or not applicable: Secondary | ICD-10-CM | POA: Insufficient documentation

## 2015-07-08 DIAGNOSIS — Y999 Unspecified external cause status: Secondary | ICD-10-CM | POA: Diagnosis not present

## 2015-07-08 DIAGNOSIS — Z79899 Other long term (current) drug therapy: Secondary | ICD-10-CM | POA: Insufficient documentation

## 2015-07-08 DIAGNOSIS — M795 Residual foreign body in soft tissue: Secondary | ICD-10-CM | POA: Diagnosis not present
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
## 2015-07-08 NOTE — ED Notes (Signed)
PT PULSE JUMPED UP TO 156 WHILE PA BROWNING WAS ATTEMPTING TO TAKE THE BACK OF EAR RING OFF PT LT EAR

## 2015-07-08 NOTE — ED Provider Notes (Signed)
CSN: [id number]     Arrival date & time 07/08/15  2251 History   First MD Initiated Contact with Patient 07/08/15 2312     Chief Complaint  Patient presents with  . Foreign Body in Skin     (Consider location/radiation/quality/duration/timing/severity/associated sxs/prior Treatment) HPI Comments: Patient with a chief complaint of foreign body in left ear lobe.  Reports that his earring back is stuck in his ear.  He denies any fevers, chills, discharge, or pain.  Denies any other associated symptoms.  The history is provided by the patient. No language interpreter was used.    History reviewed. No pertinent past medical history. Past Surgical History  Procedure Laterality Date  . Ankle surgery     History reviewed. No pertinent family history. Social History  Substance Use Topics  . Smoking status: Former Games developer  . Smokeless tobacco: Never Used  . Alcohol Use: Yes    Review of Systems  Constitutional: Negative for fever and chills.  Respiratory: Negative for shortness of breath.   Cardiovascular: Negative for chest pain.  Gastrointestinal: Negative for nausea, vomiting, diarrhea and constipation.  Genitourinary: Negative for dysuria.      Allergies  Review of patient's allergies indicates no known allergies.  Home Medications   Prior to Admission medications   Medication Sig Start Date End Date Taking? Authorizing Provider  albuterol (PROVENTIL HFA;VENTOLIN HFA) 108 (90 BASE) MCG/ACT inhaler Inhale 2 puffs into the lungs every 6 (six) hours as needed for wheezing or shortness of breath. 11/30/14   Linna Hoff, MD  chlorpheniramine-HYDROcodone (TUSSIONEX) 10-8 MG/5ML LQCR Take 5 mLs by mouth every 12 (twelve) hours as needed for cough. 11/24/13   Reuben Likes, MD  doxycycline (VIBRAMYCIN) 100 MG capsule Take 1 capsule (100 mg total) by mouth 2 (two) times daily. 11/30/14   Linna Hoff, MD  HYDROcodone-acetaminophen (NORCO/VICODIN) 5-325 MG per tablet 1 to 2 tabs every  4 to 6 hours as needed for pain. 11/24/13   Reuben Likes, MD  oseltamivir (TAMIFLU) 75 MG capsule Take 1 capsule (75 mg total) by mouth every 12 (twelve) hours. 11/24/13   Reuben Likes, MD  predniSONE (DELTASONE) 20 MG tablet Take 3 daily for 5 days, 2 daily for 5 days, 1 daily for 5 days. 11/24/13   Reuben Likes, MD   BP 115/75 mmHg  Pulse 156  Temp(Src) 98.6 F (37 C) (Oral)  Resp 18  SpO2 92% Physical Exam  Constitutional: He is oriented to person, place, and time. He appears well-developed and well-nourished. No distress.  HENT:  Head: Normocephalic and atraumatic.  Backing of earring remains in left ear lobe No discharge No sign of infection   Eyes: Conjunctivae and EOM are normal.  Neck: Normal range of motion.  Pulmonary/Chest: Effort normal. No respiratory distress.  Abdominal: He exhibits no distension.  Musculoskeletal: Normal range of motion.  Neurological: He is alert and oriented to person, place, and time.  Skin: Skin is dry. He is not diaphoretic.  Psychiatric: He has a normal mood and affect. His behavior is normal. Judgment and thought content normal.  Nursing note and vitals reviewed.   ED Course  .Foreign Body Removal Date/Time: 07/08/2015 11:43 PM Performed by: Roxy Horseman Authorized by: Roxy Horseman Consent: Verbal consent obtained. Risks and benefits: risks, benefits and alternatives were discussed Consent given by: patient Patient understanding: patient states understanding of the procedure being performed Patient consent: the patient's understanding of the procedure matches consent given Procedure consent: procedure  consent matches procedure scheduled Relevant documents: relevant documents present and verified Test results: test results available and properly labeled Site marked: the operative site was marked Imaging studies: imaging studies available Required items: required blood products, implants, devices, and special equipment  available Patient identity confirmed: verbally with patient Time out: Immediately prior to procedure a "time out" was called to verify the correct patient, procedure, equipment, support staff and site/side marked as required. Body area: ear Location details: left ear Patient sedated: no Patient restrained: no Patient cooperative: yes Removal mechanism: forceps Complexity: simple 1 objects recovered. Objects recovered: earring backing Post-procedure assessment: foreign body removed Patient tolerance: Patient tolerated the procedure well with no immediate complications   (including critical care time)   MDM   Final diagnoses:  Foreign body (FB) in soft tissue   Patient in no distress presents with FB in left ear lobe.  No evidence of infection. HR vitals captured precisely during foreign body removal, which was done quickly at the bedside and without local anesthesia given the location of the foreign body.  Question recorded HR, however patient was discharged from triage without repeat vitals.  He was not in any distress pre or post foreign body removal. He was not diaphoretic, and did not reports any palpitations, SOB, or CP.    Roxy HorsemanRobert Teirra Carapia, PA-C 07/08/15 2345  Arby BarretteMarcy Pfeiffer, MD 07/09/15 [phone]

## 2015-07-08 NOTE — Discharge Instructions (Signed)
Ear Foreign Body °An ear foreign body is an object that is stuck in your ear. The object is usually stuck in the ear canal. °CAUSES °In all ages of people, the most common foreign bodies are insects that enter the ear canal. It is common for young children to put objects into the ear canal. These may include pebbles, beads, parts of toys, and any other small objects that fit into the ear. In adults, objects such as cotton swabs may become lodged in the ear canal.  °SIGNS AND SYMPTOMS °A foreign body in the ear may cause: °· Pain. °· Buzzing or roaring sounds. °· Hearing loss. °· Ear drainage or bleeding. °· Nausea and vomiting. °· A feeling that your ear is full. °DIAGNOSIS °Your health care provider may be able to diagnose an ear foreign body based on the information that you provide, your symptoms, and a physical exam. Your health care provider may also perform tests, such as testing your hearing and your ear pressure, to check for infection or other problems that are caused by the foreign body in your ear. °TREATMENT °Treatment depends on what the foreign body is, the location of the foreign body in your ear, and whether or not the foreign body has injured any part of your inner ear. If the foreign body is visible to your health care provider, it may be possible to remove the foreign body using: °· A tool, such as medical tweezers (forceps) or a suction tube (catheter). °· Irrigation. This uses water to flush the foreign body out of your ear. This is used only if the foreign body is not likely to swell or enlarge when it is put in water. °If the foreign body is not visible or your health care provider was not able to remove the foreign body, you may be referred to a specialist for removal. You may also be prescribed antibiotic medicine or ear drops to prevent infection. If the foreign body has caused injury to other parts of your ear, you may need additional treatment. °HOME CARE INSTRUCTIONS °· Keep all  follow-up visits as directed by your health care provider. This is important. °· Take medicines only as directed by your health care provider. °· If you were prescribed an antibiotic medicine, finish it all even if you start to feel better. °PREVENTION °· Keep small objects out of reach of young children. Tell children not to put anything in their ears. °· Do not put anything in your ear, including cotton swabs, to clean your ears. Talk to your health care provider about how to clean your ears safely. °SEEK MEDICAL CARE IF: °· You have a headache. °· Your have blood coming from your ear. °· You have a fever. °· You have increased pain or swelling of your ear. °· Your hearing is reduced. °· You have discharge coming from your ear. °  °This information is not intended to replace advice given to you by your health care provider. Make sure you discuss any questions you have with your health care provider. °  °Document Released: 01/05/2000 Document Revised: 01/28/2014 Document Reviewed: 08/23/2013 °Elsevier Interactive Patient Education ©2016 [employer] ° °

## 2015-07-08 NOTE — ED Notes (Signed)
Patient arrives with back of earring stuck in left ear lobe. Object extracted by Dahlia ClientBrowning PA, please see his notes for further details of this patient's care.

## 2017-10-16 ENCOUNTER — Other Ambulatory Visit: Payer: Self-pay

## 2017-10-16 ENCOUNTER — Encounter (HOSPITAL_COMMUNITY): Payer: Self-pay | Admitting: Emergency Medicine

## 2017-10-16 DIAGNOSIS — H00011 Hordeolum externum right upper eyelid: Secondary | ICD-10-CM | POA: Insufficient documentation

## 2017-10-16 DIAGNOSIS — Z87891 Personal history of nicotine dependence: Secondary | ICD-10-CM | POA: Insufficient documentation

## 2017-10-16 NOTE — ED Triage Notes (Signed)
Patient c/o swelling and pain to right eye x3 days. Denies drainage and blurred vision.

## 2017-10-17 ENCOUNTER — Encounter (HOSPITAL_COMMUNITY): Payer: Self-pay | Admitting: Emergency Medicine

## 2017-10-17 ENCOUNTER — Emergency Department (HOSPITAL_COMMUNITY)
Admission: EM | Admit: 2017-10-17 | Discharge: 2017-10-17 | Disposition: A | Discharge status: Home / Self Care | Payer: BC Managed Care – PPO | Attending: Emergency Medicine | Admitting: Emergency Medicine

## 2017-10-17 DIAGNOSIS — H00011 Hordeolum externum right upper eyelid: Secondary | ICD-10-CM

## 2017-10-17 MED ORDER — ERYTHROMYCIN 5 MG/GM OP OINT: TOPICAL_OINTMENT | OPHTHALMIC | 0 refills | Status: DC

## 2017-10-17 NOTE — ED Notes (Signed)
Visual Acuity: both eyes:20/20, rt. Eye:20/20, lt. Eye 20/20, No corrective lenses.

## 2017-10-17 NOTE — ED Provider Notes (Signed)
[facility]-EMERGENCY DEPT Provider Note   CSN: [id number] Arrival date & time: 10/16/17  2128     History   Chief Complaint Chief Complaint  Patient presents with  . Eye Pain    HPI Russell Abebe is a 27 y.o. male.  The history is provided by the patient.  Eye Problem   This is a new problem. The current episode started more than 2 days ago. The problem occurs constantly. The problem has not changed since onset.There is a problem in the right eye. There was no injury mechanism. The pain is mild. There is no history of trauma to the eye. There is no known exposure to pink eye. He does not wear contacts. Pertinent negatives include no decreased vision, no discharge, no photophobia, no eye redness and no weakness. Associated symptoms comments: Swelling in the central portion of the Right upper lid. He has tried nothing for the symptoms. The treatment provided no relief.  No swelling of the face, no redness of the eye.  No f/c/r.  No changes in vision.    History reviewed. No pertinent past medical history.  Patient Active Problem List   Diagnosis Date Noted  . JAW PAIN 01/24/2009  . DEPRESSION, HX OF 05/12/2008    Past Surgical History:  Procedure Laterality Date  . ANKLE SURGERY          Home Medications    Prior to Admission medications   Medication Sig Start Date End Date Taking? Authorizing Provider  albuterol (PROVENTIL HFA;VENTOLIN HFA) 108 (90 BASE) MCG/ACT inhaler Inhale 2 puffs into the lungs every 6 (six) hours as needed for wheezing or shortness of breath. 11/30/14   Linna Hoff, MD  chlorpheniramine-HYDROcodone (TUSSIONEX) 10-8 MG/5ML LQCR Take 5 mLs by mouth every 12 (twelve) hours as needed for cough. 11/24/13   Reuben Likes, MD  doxycycline (VIBRAMYCIN) 100 MG capsule Take 1 capsule (100 mg total) by mouth 2 (two) times daily. 11/30/14   Linna Hoff, MD  HYDROcodone-acetaminophen (NORCO/VICODIN) 5-325 MG per tablet 1 to 2 tabs  every 4 to 6 hours as needed for pain. 11/24/13   Reuben Likes, MD  oseltamivir (TAMIFLU) 75 MG capsule Take 1 capsule (75 mg total) by mouth every 12 (twelve) hours. 11/24/13   Reuben Likes, MD  predniSONE (DELTASONE) 20 MG tablet Take 3 daily for 5 days, 2 daily for 5 days, 1 daily for 5 days. 11/24/13   Reuben Likes, MD    Family History No family history on file.  Social History Social History   Tobacco Use  . Smoking status: Former Games developer  . Smokeless tobacco: Never Used  Substance Use Topics  . Alcohol use: Yes  . Drug use: No     Allergies   Patient has no known allergies.   Review of Systems Review of Systems  Constitutional: Negative for fever.  Eyes: Negative for photophobia, discharge, redness and visual disturbance.  Respiratory: Negative for shortness of breath.   Cardiovascular: Negative for chest pain.  Musculoskeletal: Negative for arthralgias.  Neurological: Negative for facial asymmetry and weakness.  All other systems reviewed and are negative.    Physical Exam Updated Vital Signs BP 114/68   Pulse 72   Temp 99.2 F (37.3 C) (Oral)   Resp 12   Ht 5\' 9"  (1.753 m)   Wt 74.8 kg   SpO2 99%   BMI 24.37 kg/m   Physical Exam  Constitutional: He is oriented to person, place,  and time. He appears well-developed and well-nourished. No distress.  HENT:  Head: Normocephalic and atraumatic.  Eyes: Pupils are equal, round, and reactive to light. Conjunctivae and EOM are normal. Right eye exhibits no chemosis, no discharge and no exudate. Left eye exhibits no chemosis and no discharge. Right conjunctiva is not injected.    Neck: Normal range of motion. Neck supple.  Cardiovascular: Normal rate, regular rhythm, normal heart sounds and intact distal pulses.  Pulmonary/Chest: Effort normal and breath sounds normal. No respiratory distress.  Abdominal: Soft. Bowel sounds are normal. There is no tenderness.  Musculoskeletal: Normal range of motion.    Neurological: He is alert and oriented to person, place, and time.  Skin: Skin is warm and dry. Capillary refill takes less than 2 seconds.  Psychiatric: He has a normal mood and affect.     ED Treatments / Results  Labs (all labs ordered are listed, but only abnormal results are displayed) Labs Reviewed - No data to display  EKG None  Radiology No results found.  Procedures Procedures (including critical care time)    Visual Acuity  Right Eye Distance:   Left Eye Distance:   Bilateral Distance:    Right Eye Near:   Left Eye Near:    Bilateral Near:   Performed and was 20/20    Final Clinical Impressions(s) / ED Diagnoses   Advised warm Qtip tip with Burkemper's baby shampoo along lash line twice daily for 7 days and follow up with an eyecare specialist.    Return for weakness, numbness, changes in vision or speech, fevers >100.4 unrelieved by medication, shortness of breath, intractable vomiting, or diarrhea, abdominal pain, Inability to tolerate liquids or food, cough, altered mental status or any concerns. No signs of systemic illness or infection. The patient is nontoxic-appearing on exam and vital signs are within normal limits.    I have reviewed the triage vital signs and the nursing notes. Pertinent labs &imaging results that were available during my care of the patient were reviewed by me and considered in my medical decision making (see chart for details).  After history, exam, and medical workup I feel the patient has been appropriately medically screened and is safe for discharge home. Pertinent diagnoses were discussed with the patient. Patient was given return precautions.    Aeron Lheureux, MD 10/17/17 [phone]

## 2017-10-17 NOTE — Discharge Instructions (Addendum)
Sudduth's baby shampoo on Qtip with warm water move across lash line twice daily for 7 days

## 2019-07-27 ENCOUNTER — Emergency Department (HOSPITAL_COMMUNITY): Payer: No Typology Code available for payment source

## 2019-07-27 ENCOUNTER — Encounter (HOSPITAL_COMMUNITY): Payer: Self-pay

## 2019-07-27 ENCOUNTER — Emergency Department (HOSPITAL_COMMUNITY)
Admission: EM | Admit: 2019-07-27 | Discharge: 2019-07-27 | Disposition: A | Discharge status: Home / Self Care | Payer: No Typology Code available for payment source | Attending: Emergency Medicine | Admitting: Emergency Medicine

## 2019-07-27 ENCOUNTER — Other Ambulatory Visit: Payer: Self-pay

## 2019-07-27 DIAGNOSIS — Y92410 Unspecified street and highway as the place of occurrence of the external cause: Secondary | ICD-10-CM | POA: Insufficient documentation

## 2019-07-27 DIAGNOSIS — M542 Cervicalgia: Secondary | ICD-10-CM | POA: Insufficient documentation

## 2019-07-27 DIAGNOSIS — R0789 Other chest pain: Secondary | ICD-10-CM | POA: Insufficient documentation

## 2019-07-27 DIAGNOSIS — Z87891 Personal history of nicotine dependence: Secondary | ICD-10-CM | POA: Diagnosis not present

## 2019-07-27 DIAGNOSIS — M62830 Muscle spasm of back: Secondary | ICD-10-CM | POA: Diagnosis not present

## 2019-07-27 DIAGNOSIS — Y939 Activity, unspecified: Secondary | ICD-10-CM | POA: Insufficient documentation

## 2019-07-27 DIAGNOSIS — S060X0A Concussion without loss of consciousness, initial encounter: Secondary | ICD-10-CM | POA: Diagnosis not present

## 2019-07-27 DIAGNOSIS — Y999 Unspecified external cause status: Secondary | ICD-10-CM | POA: Diagnosis not present

## 2019-07-27 DIAGNOSIS — S0990XA Unspecified injury of head, initial encounter: Secondary | ICD-10-CM | POA: Diagnosis present

## 2019-07-27 MED ORDER — CYCLOBENZAPRINE HCL 10 MG PO TABS
10.0000 mg | ORAL_TABLET | Freq: Two times a day (BID) | ORAL | 0 refills | Status: DC | PRN
Start: 2019-07-27 — End: 2019-11-16
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
## 2019-07-27 NOTE — Discharge Instructions (Addendum)
Your work-up today showed no evidence of fracture, dislocation, or intracranial injury.  We suspect you have a mild concussion and we felt muscle spasms on your back.  Please use the muscle relaxant with your symptoms and limits over-the-counter anti-inflammatory and pain medications.  Please rest and stay hydrated.  Please follow-up with your primary doctor.  If any symptoms change or worsen, please return to the nearest emergency department.
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
## 2019-10-31 ENCOUNTER — Emergency Department (HOSPITAL_COMMUNITY): Payer: Self-pay

## 2019-10-31 ENCOUNTER — Encounter (HOSPITAL_COMMUNITY): Payer: Self-pay | Admitting: Emergency Medicine

## 2019-10-31 ENCOUNTER — Emergency Department (HOSPITAL_COMMUNITY)
Admission: EM | Admit: 2019-10-31 | Discharge: 2019-10-31 | Disposition: A | Discharge status: Home / Self Care | Payer: Self-pay | Attending: Emergency Medicine | Admitting: Emergency Medicine

## 2019-10-31 ENCOUNTER — Other Ambulatory Visit: Payer: Self-pay

## 2019-10-31 DIAGNOSIS — S42102A Fracture of unspecified part of scapula, left shoulder, initial encounter for closed fracture: Secondary | ICD-10-CM

## 2019-10-31 DIAGNOSIS — W3400XA Accidental discharge from unspecified firearms or gun, initial encounter: Secondary | ICD-10-CM

## 2019-10-31 DIAGNOSIS — S42109A Fracture of unspecified part of scapula, unspecified shoulder, initial encounter for closed fracture: Secondary | ICD-10-CM

## 2019-10-31 DIAGNOSIS — Z20822 Contact with and (suspected) exposure to covid-19: Secondary | ICD-10-CM | POA: Insufficient documentation

## 2019-10-31 DIAGNOSIS — Y9389 Activity, other specified: Secondary | ICD-10-CM | POA: Insufficient documentation

## 2019-10-31 HISTORY — DX: Fracture of unspecified part of scapula, unspecified shoulder, initial encounter for closed fracture: S42.109A

## 2019-10-31 LAB — LACTIC ACID, PLASMA
Lactic Acid, Venous: 4.4 mmol/L (ref 0.5–1.9)
Lactic Acid, Venous: 1.3 mmol/L (ref 0.5–1.9)

## 2019-10-31 LAB — CBC
HCT: 43.7 % (ref 39.0–52.0)
Hemoglobin: 15 g/dL (ref 13.0–17.0)
MCH: 31 pg (ref 26.0–34.0)
MCHC: 34.3 g/dL (ref 30.0–36.0)
MCV: 90.3 fL (ref 80.0–100.0)
Platelets: 151 10*3/uL (ref 150–400)
RBC: 4.84 MIL/uL (ref 4.22–5.81)
RDW: 13.3 % (ref 11.5–15.5)
WBC: 5.3 10*3/uL (ref 4.0–10.5)
nRBC: 0 % (ref 0.0–0.2)

## 2019-10-31 LAB — I-STAT CHEM 8, ED
BUN: 13 mg/dL (ref 6–20)
Calcium, Ion: 1.09 mmol/L — ABNORMAL LOW (ref 1.15–1.40)
Chloride: 104 mmol/L (ref 98–111)
Creatinine, Ser: 1.4 mg/dL — ABNORMAL HIGH (ref 0.61–1.24)
Glucose, Bld: 119 mg/dL — ABNORMAL HIGH (ref 70–99)
HCT: 45 % (ref 39.0–52.0)
Hemoglobin: 15.3 g/dL (ref 13.0–17.0)
Potassium: 3.5 mmol/L (ref 3.5–5.1)
Sodium: 140 mmol/L (ref 135–145)
TCO2: 21 mmol/L — ABNORMAL LOW (ref 22–32)

## 2019-10-31 LAB — COMPREHENSIVE METABOLIC PANEL
ALT: 24 U/L (ref 0–44)
AST: 22 U/L (ref 15–41)
Albumin: 3.9 g/dL (ref 3.5–5.0)
Alkaline Phosphatase: 81 U/L (ref 38–126)
Anion gap: 16 — ABNORMAL HIGH (ref 5–15)
BUN: 12 mg/dL (ref 6–20)
CO2: 20 mmol/L — ABNORMAL LOW (ref 22–32)
Calcium: 9.2 mg/dL (ref 8.9–10.3)
Chloride: 104 mmol/L (ref 98–111)
Creatinine, Ser: 1.56 mg/dL — ABNORMAL HIGH (ref 0.61–1.24)
GFR, Estimated: 59 mL/min — ABNORMAL LOW (ref >60)
Glucose, Bld: 123 mg/dL — ABNORMAL HIGH (ref 70–99)
Potassium: 3.6 mmol/L (ref 3.5–5.1)
Sodium: 140 mmol/L (ref 135–145)
Total Bilirubin: 0.6 mg/dL (ref 0.3–1.2)
Total Protein: 6.9 g/dL (ref 6.5–8.1)

## 2019-10-31 LAB — RESPIRATORY PANEL BY RT PCR (FLU A&B, COVID)
Influenza A by PCR: NEGATIVE
Influenza B by PCR: NEGATIVE
SARS Coronavirus 2 by RT PCR: NEGATIVE

## 2019-10-31 LAB — PROTIME-INR
INR: 0.9 (ref 0.8–1.2)
Prothrombin Time: 12.1 seconds (ref 11.4–15.2)

## 2019-10-31 LAB — ETHANOL: Alcohol, Ethyl (B): <10 mg/dL (ref <10)

## 2019-10-31 LAB — SAMPLE TO BLOOD BANK

## 2019-10-31 MED ORDER — SODIUM CHLORIDE 0.9 % IV SOLN
INTRAVENOUS | Status: AC | PRN
  Administered 2019-10-31: 1000 mL via INTRAVENOUS

## 2019-10-31 MED ORDER — IOHEXOL 300 MG/ML  SOLN
75.0000 mL | Freq: Once | INTRAMUSCULAR | Status: AC | PRN
  Administered 2019-10-31: 75 mL via INTRAVENOUS

## 2019-10-31 MED ORDER — HYDROMORPHONE HCL 1 MG/ML IJ SOLN
INTRAMUSCULAR | Status: AC
  Administered 2019-10-31: 1 mg via INTRAVENOUS
  Filled 2019-10-31: qty 1

## 2019-10-31 MED ORDER — HYDROMORPHONE HCL 1 MG/ML IJ SOLN: 1.0000 mg | Freq: Once | INTRAMUSCULAR | Status: AC

## 2019-10-31 MED ORDER — FENTANYL CITRATE (PF) 100 MCG/2ML IJ SOLN
INTRAMUSCULAR | Status: AC
  Administered 2019-10-31: 50 ug via INTRAVENOUS
  Filled 2019-10-31: qty 2

## 2019-10-31 MED ORDER — HYDROMORPHONE HCL 1 MG/ML IJ SOLN
1.0000 mg | Freq: Once | INTRAMUSCULAR | Status: AC
  Administered 2019-10-31: 1 mg via INTRAVENOUS

## 2019-10-31 MED ORDER — HYDROMORPHONE HCL 1 MG/ML IJ SOLN
INTRAMUSCULAR | Status: AC
  Filled 2019-10-31: qty 1

## 2019-10-31 MED ORDER — OXYCODONE HCL 5 MG PO TABS: 5.0000 mg | ORAL_TABLET | ORAL | 0 refills | Status: DC | PRN

## 2019-10-31 MED ORDER — CEPHALEXIN 500 MG PO CAPS: 500.0000 mg | ORAL_CAPSULE | Freq: Three times a day (TID) | ORAL | 0 refills | Status: AC

## 2019-10-31 MED ORDER — SODIUM CHLORIDE 0.9 % IV BOLUS
1000.0000 mL | Freq: Once | INTRAVENOUS | Status: AC
  Administered 2019-10-31: 1000 mL via INTRAVENOUS

## 2019-10-31 MED ORDER — FENTANYL CITRATE (PF) 100 MCG/2ML IJ SOLN: 50.0000 ug | Freq: Once | INTRAMUSCULAR | Status: AC
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
## 2019-10-31 NOTE — ED Notes (Signed)
Pt mother was present at last dressing change, instructions given on use of gauze, tape. Pt was given both verbal and written instructions. W/c was brought to bedside, pt refused the same for d/c. Pt ambulated to POV independently, tech walked with patient.
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
## 2019-10-31 NOTE — ED Notes (Signed)
Security called requesting I speak with patients father in the waiting area, requesting to see patient. Visitor policy reinforced, offered him to speak with the patient on phone, refused. Pt mother temporarily stepped out of room.
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
## 2019-10-31 NOTE — ED Notes (Signed)
Left upper back dressing saturated, pressure dressing applied to the site

## 2019-11-01 ENCOUNTER — Ambulatory Visit (INDEPENDENT_AMBULATORY_CARE_PROVIDER_SITE_OTHER): Payer: Self-pay | Admitting: Orthopaedic Surgery

## 2019-11-01 ENCOUNTER — Encounter (HOSPITAL_COMMUNITY): Payer: Self-pay

## 2019-11-01 ENCOUNTER — Telehealth: Payer: Self-pay | Admitting: Orthopaedic Surgery

## 2019-11-01 DIAGNOSIS — S42115A Nondisplaced fracture of body of scapula, left shoulder, initial encounter for closed fracture: Secondary | ICD-10-CM

## 2019-11-01 DIAGNOSIS — S21332A Puncture wound without foreign body of left front wall of thorax with penetration into thoracic cavity, initial encounter: Secondary | ICD-10-CM

## 2019-11-01 MED ORDER — TIZANIDINE HCL 4 MG PO TABS
4.0000 mg | ORAL_TABLET | Freq: Three times a day (TID) | ORAL | 0 refills | Status: DC | PRN
Start: 2019-11-01 — End: 2019-11-16

## 2019-11-01 MED ORDER — OXYCODONE HCL 5 MG PO TABS: 5.0000 mg | ORAL_TABLET | ORAL | 0 refills | Status: DC | PRN
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
## 2019-11-03 ENCOUNTER — Ambulatory Visit: Payer: Self-pay | Admitting: Physician Assistant

## 2019-11-05 ENCOUNTER — Telehealth: Payer: Self-pay | Admitting: Orthopaedic Surgery

## 2019-11-05 NOTE — Telephone Encounter (Signed)
Patient's mother Glee Arvin called advised patient need a note for his employer stating how long he will be out of work. Patient's mother advised patient just got a new job and would like to keep it that's why he need the note. Lucinda said the employer will need to know  exactly how long after surgery the patient will be out of work as well.     The number to contact Glee Arvin is [phone]

## 2019-11-05 NOTE — Telephone Encounter (Signed)
Please advise 
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
## 2019-11-09 NOTE — Telephone Encounter (Signed)
Note placed at front desk...Marland KitchenMarland Kitchenlvm informing

## 2019-11-09 NOTE — Telephone Encounter (Signed)
Work note completed and printed

## 2019-11-15 ENCOUNTER — Ambulatory Visit (INDEPENDENT_AMBULATORY_CARE_PROVIDER_SITE_OTHER): Payer: Self-pay

## 2019-11-15 ENCOUNTER — Encounter: Payer: Self-pay | Admitting: Orthopaedic Surgery

## 2019-11-15 ENCOUNTER — Ambulatory Visit (INDEPENDENT_AMBULATORY_CARE_PROVIDER_SITE_OTHER): Payer: Self-pay | Admitting: Orthopaedic Surgery

## 2019-11-15 DIAGNOSIS — S42115D Nondisplaced fracture of body of scapula, left shoulder, subsequent encounter for fracture with routine healing: Secondary | ICD-10-CM

## 2019-11-15 DIAGNOSIS — S20459D Superficial foreign body of unspecified back wall of thorax, subsequent encounter: Secondary | ICD-10-CM
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
## 2019-11-16 ENCOUNTER — Other Ambulatory Visit (HOSPITAL_COMMUNITY)
Admission: RE | Admit: 2019-11-16 | Discharge: 2019-11-16 | Disposition: A | Discharge status: Home / Self Care | Payer: Self-pay | Source: Ambulatory Visit | Attending: Orthopaedic Surgery | Admitting: Orthopaedic Surgery

## 2019-11-16 ENCOUNTER — Encounter (HOSPITAL_BASED_OUTPATIENT_CLINIC_OR_DEPARTMENT_OTHER): Payer: Self-pay | Admitting: Orthopaedic Surgery

## 2019-11-16 ENCOUNTER — Other Ambulatory Visit: Payer: Self-pay | Admitting: Physician Assistant

## 2019-11-16 ENCOUNTER — Other Ambulatory Visit: Payer: Self-pay

## 2019-11-16 DIAGNOSIS — Z20822 Contact with and (suspected) exposure to covid-19: Secondary | ICD-10-CM | POA: Diagnosis not present

## 2019-11-16 DIAGNOSIS — Z01812 Encounter for preprocedural laboratory examination: Secondary | ICD-10-CM | POA: Diagnosis not present

## 2019-11-16 LAB — SARS CORONAVIRUS 2 (TAT 6-24 HRS): SARS Coronavirus 2: NEGATIVE

## 2019-11-18 ENCOUNTER — Encounter (HOSPITAL_BASED_OUTPATIENT_CLINIC_OR_DEPARTMENT_OTHER): Payer: Self-pay | Admitting: Orthopaedic Surgery

## 2019-11-18 ENCOUNTER — Other Ambulatory Visit: Payer: Self-pay

## 2019-11-18 ENCOUNTER — Ambulatory Visit (HOSPITAL_BASED_OUTPATIENT_CLINIC_OR_DEPARTMENT_OTHER): Payer: Self-pay | Admitting: Certified Registered"

## 2019-11-18 ENCOUNTER — Ambulatory Visit (HOSPITAL_BASED_OUTPATIENT_CLINIC_OR_DEPARTMENT_OTHER)
Admission: RE | Admit: 2019-11-18 | Discharge: 2019-11-18 | Disposition: A | Discharge status: Home / Self Care | Payer: Self-pay | Attending: Orthopaedic Surgery | Admitting: Orthopaedic Surgery

## 2019-11-18 ENCOUNTER — Encounter (HOSPITAL_BASED_OUTPATIENT_CLINIC_OR_DEPARTMENT_OTHER)
Admission: RE | Disposition: A | Discharge status: Home / Self Care | Payer: Self-pay | Source: Home / Self Care | Attending: Orthopaedic Surgery

## 2019-11-18 DIAGNOSIS — S20459A Superficial foreign body of unspecified back wall of thorax, initial encounter: Secondary | ICD-10-CM

## 2019-11-18 DIAGNOSIS — W3400XS Accidental discharge from unspecified firearms or gun, sequela: Secondary | ICD-10-CM | POA: Insufficient documentation

## 2019-11-18 DIAGNOSIS — S20459D Superficial foreign body of unspecified back wall of thorax, subsequent encounter: Secondary | ICD-10-CM

## 2019-11-18 DIAGNOSIS — S20452S Superficial foreign body of left back wall of thorax, sequela: Secondary | ICD-10-CM | POA: Insufficient documentation

## 2019-11-18 HISTORY — DX: Unspecified asthma, uncomplicated: J45.909

## 2019-11-18 HISTORY — PX: FOREIGN BODY REMOVAL: SHX962

## 2019-11-18 SURGERY — FOREIGN BODY REMOVAL ADULT
Anesthesia: General | Site: Back | Laterality: Left

## 2019-11-18 MED ORDER — PROPOFOL 500 MG/50ML IV EMUL
INTRAVENOUS | Status: AC
  Filled 2019-11-18: qty 50

## 2019-11-18 MED ORDER — FENTANYL CITRATE (PF) 100 MCG/2ML IJ SOLN
INTRAMUSCULAR | Status: AC
  Filled 2019-11-18: qty 2

## 2019-11-18 MED ORDER — MIDAZOLAM HCL 2 MG/2ML IJ SOLN
INTRAMUSCULAR | Status: AC
  Filled 2019-11-18: qty 2

## 2019-11-18 MED ORDER — KETOROLAC TROMETHAMINE 30 MG/ML IJ SOLN: 30.0000 mg | Freq: Once | INTRAMUSCULAR | Status: DC | PRN

## 2019-11-18 MED ORDER — ROCURONIUM BROMIDE 10 MG/ML (PF) SYRINGE
PREFILLED_SYRINGE | INTRAVENOUS | Status: AC
  Filled 2019-11-18: qty 10

## 2019-11-18 MED ORDER — OXYCODONE HCL 5 MG PO TABS: 5.0000 mg | ORAL_TABLET | Freq: Once | ORAL | Status: DC | PRN

## 2019-11-18 MED ORDER — LIDOCAINE HCL (CARDIAC) PF 100 MG/5ML IV SOSY
PREFILLED_SYRINGE | INTRAVENOUS | Status: DC | PRN
  Administered 2019-11-18: 60 mg via INTRAVENOUS

## 2019-11-18 MED ORDER — BUPIVACAINE HCL (PF) 0.25 % IJ SOLN
INTRAMUSCULAR | Status: AC
  Filled 2019-11-18: qty 150

## 2019-11-18 MED ORDER — DEXAMETHASONE SODIUM PHOSPHATE 10 MG/ML IJ SOLN
INTRAMUSCULAR | Status: DC | PRN
  Administered 2019-11-18: 8 mg via INTRAVENOUS

## 2019-11-18 MED ORDER — DEXAMETHASONE SODIUM PHOSPHATE 10 MG/ML IJ SOLN
INTRAMUSCULAR | Status: AC
  Filled 2019-11-18: qty 1

## 2019-11-18 MED ORDER — OXYCODONE HCL 5 MG/5ML PO SOLN: 5.0000 mg | Freq: Once | ORAL | Status: DC | PRN

## 2019-11-18 MED ORDER — PROMETHAZINE HCL 25 MG/ML IJ SOLN: 6.2500 mg | INTRAMUSCULAR | Status: DC | PRN

## 2019-11-18 MED ORDER — LIDOCAINE-EPINEPHRINE (PF) 1 %-1:200000 IJ SOLN
INTRAMUSCULAR | Status: AC
  Filled 2019-11-18: qty 30

## 2019-11-18 MED ORDER — ONDANSETRON HCL 4 MG/2ML IJ SOLN
INTRAMUSCULAR | Status: AC
  Filled 2019-11-18: qty 2

## 2019-11-18 MED ORDER — BUPIVACAINE HCL (PF) 0.25 % IJ SOLN
INTRAMUSCULAR | Status: DC | PRN
  Administered 2019-11-18: 10 mL

## 2019-11-18 MED ORDER — LIDOCAINE HCL (PF) 1 % IJ SOLN
INTRAMUSCULAR | Status: AC
  Filled 2019-11-18: qty 30

## 2019-11-18 MED ORDER — LACTATED RINGERS IV SOLN: INTRAVENOUS | Status: DC

## 2019-11-18 MED ORDER — PROPOFOL 10 MG/ML IV BOLUS
INTRAVENOUS | Status: AC
  Filled 2019-11-18: qty 40

## 2019-11-18 MED ORDER — CEFAZOLIN SODIUM-DEXTROSE 2-4 GM/100ML-% IV SOLN
INTRAVENOUS | Status: AC
  Filled 2019-11-18: qty 100

## 2019-11-18 MED ORDER — LIDOCAINE 2% (20 MG/ML) 5 ML SYRINGE
INTRAMUSCULAR | Status: AC
  Filled 2019-11-18: qty 5

## 2019-11-18 MED ORDER — HYDROCODONE-ACETAMINOPHEN 5-325 MG PO TABS
1.0000 | ORAL_TABLET | Freq: Four times a day (QID) | ORAL | 0 refills | Status: AC | PRN
Start: 2019-11-18 — End: 2020-11-17

## 2019-11-18 MED ORDER — MIDAZOLAM HCL 5 MG/5ML IJ SOLN
INTRAMUSCULAR | Status: DC | PRN
  Administered 2019-11-18: 2 mg via INTRAVENOUS

## 2019-11-18 MED ORDER — CEFAZOLIN SODIUM-DEXTROSE 2-4 GM/100ML-% IV SOLN
2.0000 g | INTRAVENOUS | Status: AC
  Administered 2019-11-18: 2 g via INTRAVENOUS

## 2019-11-18 MED ORDER — SUGAMMADEX SODIUM 200 MG/2ML IV SOLN
INTRAVENOUS | Status: DC | PRN
  Administered 2019-11-18: 300 mg via INTRAVENOUS

## 2019-11-18 MED ORDER — ONDANSETRON HCL 4 MG/2ML IJ SOLN
INTRAMUSCULAR | Status: DC | PRN
  Administered 2019-11-18: 4 mg via INTRAVENOUS

## 2019-11-18 MED ORDER — FENTANYL CITRATE (PF) 100 MCG/2ML IJ SOLN
INTRAMUSCULAR | Status: DC | PRN
  Administered 2019-11-18 (×4): 50 ug via INTRAVENOUS

## 2019-11-18 MED ORDER — ROCURONIUM BROMIDE 100 MG/10ML IV SOLN
INTRAVENOUS | Status: DC | PRN
  Administered 2019-11-18: 50 mg via INTRAVENOUS

## 2019-11-18 MED ORDER — FENTANYL CITRATE (PF) 100 MCG/2ML IJ SOLN: 25.0000 ug | INTRAMUSCULAR | Status: DC | PRN

## 2019-11-18 MED ORDER — PROPOFOL 10 MG/ML IV BOLUS
INTRAVENOUS | Status: DC | PRN
  Administered 2019-11-18: 150 mg via INTRAVENOUS

## 2019-11-18 SURGICAL SUPPLY — 52 items
BENZOIN TINCTURE PRP APPL 2/3 (GAUZE/BANDAGES/DRESSINGS) IMPLANT
BLADE SURG 15 STRL LF DISP TIS (BLADE) ×1 IMPLANT
BLADE SURG 15 STRL SS (BLADE) ×3
CLOSURE WOUND 1/2 X4 (GAUZE/BANDAGES/DRESSINGS)
COVER BACK TABLE 60X90IN (DRAPES) ×3 IMPLANT
COVER MAYO STAND STRL (DRAPES) ×3 IMPLANT
COVER WAND RF STERILE (DRAPES) IMPLANT
DECANTER SPIKE VIAL GLASS SM (MISCELLANEOUS) IMPLANT
DRAPE INCISE IOBAN 66X45 STRL (DRAPES) IMPLANT
DRAPE LAPAROTOMY 100X72 PEDS (DRAPES) ×3 IMPLANT
DRAPE UTILITY XL STRL (DRAPES) ×3 IMPLANT
DRSG AQUACEL AG ADV 3.5X 6 (GAUZE/BANDAGES/DRESSINGS) ×3 IMPLANT
DRSG PAD ABDOMINAL 8X10 ST (GAUZE/BANDAGES/DRESSINGS) IMPLANT
DURAPREP 26ML APPLICATOR (WOUND CARE) ×3 IMPLANT
ELECT REM PT RETURN 9FT ADLT (ELECTROSURGICAL) ×3
ELECTRODE REM PT RTRN 9FT ADLT (ELECTROSURGICAL) ×1 IMPLANT
GAUZE SPONGE 4X4 12PLY STRL (GAUZE/BANDAGES/DRESSINGS) IMPLANT
GAUZE XEROFORM 1X8 LF (GAUZE/BANDAGES/DRESSINGS) ×3 IMPLANT
GLOVE BIO SURGEON STRL SZ7 (GLOVE) ×3 IMPLANT
GLOVE BIO SURGEON STRL SZ7.5 (GLOVE) ×3 IMPLANT
GLOVE BIOGEL PI IND STRL 6.5 (GLOVE) ×1 IMPLANT
GLOVE BIOGEL PI IND STRL 8 (GLOVE) ×2 IMPLANT
GLOVE BIOGEL PI INDICATOR 6.5 (GLOVE) ×2
GLOVE BIOGEL PI INDICATOR 8 (GLOVE) ×4
GLOVE ECLIPSE 6.5 STRL STRAW (GLOVE) ×6 IMPLANT
GLOVE ECLIPSE 8.0 STRL XLNG CF (GLOVE) ×3 IMPLANT
GOWN STRL REUS W/ TWL LRG LVL3 (GOWN DISPOSABLE) ×2 IMPLANT
GOWN STRL REUS W/ TWL XL LVL3 (GOWN DISPOSABLE) ×1 IMPLANT
GOWN STRL REUS W/TWL LRG LVL3 (GOWN DISPOSABLE) ×6
GOWN STRL REUS W/TWL XL LVL3 (GOWN DISPOSABLE) ×3
NEEDLE HYPO 22GX1.5 SAFETY (NEEDLE) IMPLANT
NEEDLE HYPO 25X1 1.5 SAFETY (NEEDLE) ×3 IMPLANT
NS IRRIG 1000ML POUR BTL (IV SOLUTION) ×3 IMPLANT
PACK BASIN DAY SURGERY FS (CUSTOM PROCEDURE TRAY) ×3 IMPLANT
PENCIL SMOKE EVACUATOR (MISCELLANEOUS) ×3 IMPLANT
SHEET MEDIUM DRAPE 40X70 STRL (DRAPES) ×3 IMPLANT
SPONGE LAP 4X18 RFD (DISPOSABLE) ×3 IMPLANT
STRIP CLOSURE SKIN 1/2X4 (GAUZE/BANDAGES/DRESSINGS) IMPLANT
SUT ETHILON 2 0 FS 18 (SUTURE) ×3 IMPLANT
SUT ETHILON 3 0 PS 1 (SUTURE) IMPLANT
SUT ETHILON 4 0 PS 2 18 (SUTURE) IMPLANT
SUT VIC AB 0 SH 27 (SUTURE) IMPLANT
SUT VIC AB 2-0 SH 27 (SUTURE)
SUT VIC AB 2-0 SH 27XBRD (SUTURE) IMPLANT
SUT VIC AB 3-0 FS2 27 (SUTURE) IMPLANT
SYR BULB EAR ULCER 3OZ GRN STR (SYRINGE) ×3 IMPLANT
SYR CONTROL 10ML LL (SYRINGE) ×3 IMPLANT
TOWEL GREEN STERILE FF (TOWEL DISPOSABLE) ×3 IMPLANT
TUBE CONNECTING 20'X1/4 (TUBING) ×1
TUBE CONNECTING 20X1/4 (TUBING) ×2 IMPLANT
UNDERPAD 30X36 HEAVY ABSORB (UNDERPADS AND DIAPERS) IMPLANT
YANKAUER SUCT BULB TIP NO VENT (SUCTIONS) ×3 IMPLANT

## 2019-11-18 NOTE — Anesthesia Preprocedure Evaluation (Signed)
Anesthesia Evaluation  Patient identified by MRN, date of birth, ID band Patient awake    Reviewed: Allergy & Precautions, NPO status , Patient's Chart, lab work & pertinent test results  Airway Mallampati: II  TM Distance: >3 FB Neck ROM: Full    Dental no notable dental hx.    Pulmonary asthma ,    Pulmonary exam normal breath sounds clear to auscultation       Cardiovascular negative cardio ROS Normal cardiovascular exam Rhythm:Regular Rate:Normal     Neuro/Psych negative neurological ROS  negative psych ROS   GI/Hepatic negative GI ROS, Neg liver ROS,   Endo/Other  negative endocrine ROS  Renal/GU negative Renal ROS  negative genitourinary   Musculoskeletal negative musculoskeletal ROS (+)   Abdominal   Peds negative pediatric ROS (+)  Hematology negative hematology ROS (+)   Anesthesia Other Findings   Reproductive/Obstetrics negative OB ROS                             Anesthesia Physical Anesthesia Plan  ASA: II  Anesthesia Plan: General   Post-op Pain Management:    Induction: Intravenous  PONV Risk Score and Plan: 2 and Ondansetron, Dexamethasone and Treatment may vary due to age or medical condition  Airway Management Planned: Oral ETT  Additional Equipment:   Intra-op Plan:   Post-operative Plan: Extubation in OR  Informed Consent: I have reviewed the patients History and Physical, chart, labs and discussed the procedure including the risks, benefits and alternatives for the proposed anesthesia with the patient or authorized representative who has indicated his/her understanding and acceptance.     Dental advisory given  Plan Discussed with: CRNA and Surgeon  Anesthesia Plan Comments:         Anesthesia Quick Evaluation
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
## 2019-11-18 NOTE — Discharge Instructions (Signed)
You may increase your activities as comfort allows. Do expect some bloody drainage from your wound. You can get your wound wet daily in the shower starting 2 days from now. After each shower, place a large Band-Aid over the incision daily to protect the sutures.  Post Anesthesia Home Care Instructions  Activity: Get plenty of rest for the remainder of the day. A responsible individual must stay with you for 24 hours following the procedure.  For the next 24 hours, DO NOT: -Drive a car -Advertising copywriter -Drink alcoholic beverages -Take any medication unless instructed by your physician -Make any legal decisions or sign important papers.  Meals: Start with liquid foods such as gelatin or soup. Progress to regular foods as tolerated. Avoid greasy, spicy, heavy foods. If nausea and/or vomiting occur, drink only clear liquids until the nausea and/or vomiting subsides. Call your physician if vomiting continues.  Special Instructions/Symptoms: Your throat may feel dry or sore from the anesthesia or the breathing tube placed in your throat during surgery. If this causes discomfort, gargle with warm salt water. The discomfort should disappear within 24 hours.  If you had a scopolamine patch placed behind your ear for the management of post- operative nausea and/or vomiting:  1. The medication in the patch is effective for 72 hours, after which it should be removed.  Wrap patch in a tissue and discard in the trash. Wash hands thoroughly with soap and water. 2. You may remove the patch earlier than 72 hours if you experience unpleasant side effects which may include dry mouth, dizziness or visual disturbances. 3. Avoid touching the patch. Wash your hands with soap and water after contact with the patch.
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
## 2019-11-18 NOTE — Anesthesia Procedure Notes (Signed)
Procedure Name: Intubation Date/Time: 11/18/2019 9:03 AM Performed by: Verita Lamb, CRNA Pre-anesthesia Checklist: Patient identified, Emergency Drugs available, Suction available and Patient being monitored Patient Re-evaluated:Patient Re-evaluated prior to induction Oxygen Delivery Method: Circle system utilized Preoxygenation: Pre-oxygenation with 100% oxygen Induction Type: IV induction Ventilation: Mask ventilation without difficulty Laryngoscope Size: Mac and 3 Grade View: Grade I Tube type: Oral Tube size: 7.5 mm Number of attempts: 1 Airway Equipment and Method: Stylet and Oral airway Placement Confirmation: ETT inserted through vocal cords under direct vision,  positive ETCO2 and breath sounds checked- equal and bilateral Secured at: 23 cm Tube secured with: Tape Dental Injury: Teeth and Oropharynx as per pre-operative assessment

## 2019-11-18 NOTE — Anesthesia Procedure Notes (Signed)
Performed by: Leonid Manus M, CRNA       

## 2019-11-18 NOTE — Addendum Note (Signed)
Addendum  created 11/18/19 1100 by Garth Bigness, CRNA   Flowsheet accepted, Intraprocedure Flowsheets edited
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
## 2019-11-19 ENCOUNTER — Encounter (HOSPITAL_BASED_OUTPATIENT_CLINIC_OR_DEPARTMENT_OTHER): Payer: Self-pay | Admitting: Orthopaedic Surgery

## 2019-12-02 ENCOUNTER — Ambulatory Visit (INDEPENDENT_AMBULATORY_CARE_PROVIDER_SITE_OTHER): Payer: Self-pay | Admitting: Orthopaedic Surgery

## 2019-12-02 ENCOUNTER — Encounter: Payer: Self-pay | Admitting: Orthopaedic Surgery

## 2019-12-02 DIAGNOSIS — S20459D Superficial foreign body of unspecified back wall of thorax, subsequent encounter: Secondary | ICD-10-CM
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
## 2019-12-14 ENCOUNTER — Other Ambulatory Visit: Payer: Self-pay | Admitting: Orthopaedic Surgery

## 2019-12-15 NOTE — Telephone Encounter (Signed)
Please advise 

## 2019-12-30 ENCOUNTER — Ambulatory Visit: Payer: Self-pay | Admitting: Orthopaedic Surgery

## 2020-01-10 ENCOUNTER — Ambulatory Visit: Payer: Self-pay | Admitting: Orthopaedic Surgery

## 2021-08-11 ENCOUNTER — Other Ambulatory Visit: Payer: Self-pay

## 2021-08-11 ENCOUNTER — Encounter (HOSPITAL_COMMUNITY): Payer: Self-pay

## 2021-08-11 ENCOUNTER — Emergency Department (HOSPITAL_COMMUNITY)
Admission: EM | Admit: 2021-08-11 | Discharge: 2021-08-11 | Disposition: A | Discharge status: Home / Self Care | Payer: Self-pay | Attending: Emergency Medicine | Admitting: Emergency Medicine

## 2021-08-11 DIAGNOSIS — J45909 Unspecified asthma, uncomplicated: Secondary | ICD-10-CM | POA: Insufficient documentation

## 2021-08-11 DIAGNOSIS — B029 Zoster without complications: Secondary | ICD-10-CM | POA: Insufficient documentation

## 2021-08-11 DIAGNOSIS — Z113 Encounter for screening for infections with a predominantly sexual mode of transmission: Secondary | ICD-10-CM | POA: Insufficient documentation

## 2021-08-11 HISTORY — DX: Varicella without complication: B01.9

## 2021-08-11 LAB — RAPID HIV SCREEN (HIV 1/2 AB+AG)
HIV 1/2 Antibodies: REACTIVE — AB
HIV-1 P24 Antigen - HIV24: NONREACTIVE

## 2021-08-11 MED ORDER — ACETAMINOPHEN 325 MG PO TABS
650.0000 mg | ORAL_TABLET | Freq: Once | ORAL | Status: AC
  Administered 2021-08-11: 650 mg via ORAL
  Filled 2021-08-11: qty 2

## 2021-08-11 MED ORDER — DIPHENHYDRAMINE HCL 25 MG PO TABS: 25.0000 mg | ORAL_TABLET | Freq: Four times a day (QID) | ORAL | 0 refills | Status: DC | PRN

## 2021-08-11 MED ORDER — OXYCODONE HCL 5 MG PO TABS: 5.0000 mg | ORAL_TABLET | ORAL | 0 refills | Status: DC | PRN

## 2021-08-11 MED ORDER — IBUPROFEN 800 MG PO TABS
800.0000 mg | ORAL_TABLET | Freq: Once | ORAL | Status: AC
  Administered 2021-08-11: 800 mg via ORAL
  Filled 2021-08-11: qty 1

## 2021-08-11 MED ORDER — LIDOCAINE HCL (PF) 1 % IJ SOLN
1.0000 mL | Freq: Once | INTRAMUSCULAR | Status: AC
  Administered 2021-08-11: 1 mL
  Filled 2021-08-11: qty 30

## 2021-08-11 MED ORDER — ACETAMINOPHEN 325 MG PO TABS: 650.0000 mg | ORAL_TABLET | Freq: Four times a day (QID) | ORAL | 0 refills | Status: DC | PRN

## 2021-08-11 MED ORDER — ACYCLOVIR 800 MG PO TABS: 800.0000 mg | ORAL_TABLET | Freq: Every day | ORAL | 0 refills | Status: AC

## 2021-08-11 MED ORDER — DEXAMETHASONE 4 MG PO TABS
8.0000 mg | ORAL_TABLET | Freq: Once | ORAL | Status: AC
  Administered 2021-08-11: 8 mg via ORAL
  Filled 2021-08-11: qty 2

## 2021-08-11 MED ORDER — AZITHROMYCIN 250 MG PO TABS
1000.0000 mg | ORAL_TABLET | Freq: Once | ORAL | Status: AC
  Administered 2021-08-11: 1000 mg via ORAL
  Filled 2021-08-11: qty 4

## 2021-08-11 MED ORDER — CEFTRIAXONE SODIUM 1 G IJ SOLR
500.0000 mg | Freq: Once | INTRAMUSCULAR | Status: AC
  Administered 2021-08-11: 500 mg via INTRAMUSCULAR
  Filled 2021-08-11: qty 10

## 2021-08-11 MED ORDER — IBUPROFEN 600 MG PO TABS: 600.0000 mg | ORAL_TABLET | Freq: Four times a day (QID) | ORAL | 0 refills | Status: DC | PRN

## 2021-08-11 MED ORDER — ACYCLOVIR 200 MG PO CAPS
800.0000 mg | ORAL_CAPSULE | Freq: Once | ORAL | Status: AC
  Administered 2021-08-11: 800 mg via ORAL
  Filled 2021-08-11: qty 4
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
## 2021-08-11 NOTE — Discharge Instructions (Addendum)
It was a pleasure caring for you today in the emergency department.  Please return to the emergency department for any worsening or worrisome symptoms.  Do not take ROXICODONE/oxyCODONE prior to driving or operating heavy machinery.

## 2021-08-11 NOTE — ED Notes (Signed)
Patient was called and informed of results. He verbalized understanding that the 4th gen HIV test will be sent off and to look out for those results.

## 2021-08-11 NOTE — ED Triage Notes (Addendum)
Patient said since Monday he has had terrible right sided upper back pain. Then it spread to his right pectoral muscle. And a rash appeared with bumps from his right upper back to his right pectoral muscle. Not itchy but painful. Had chicken pox as a child.

## 2021-08-11 NOTE — ED Notes (Addendum)
Lab was called and verbalized that they received the order for the 4th gen HIV test to be sent off.

## 2021-08-12 LAB — HIV ANTIBODY (ROUTINE TESTING W REFLEX): HIV Screen 4th Generation wRfx: REACTIVE — AB

## 2021-08-13 ENCOUNTER — Telehealth: Payer: Self-pay

## 2021-08-13 LAB — HIV-1/2 AB - DIFFERENTIATION
HIV 1 Ab: REACTIVE
HIV 2 Ab: NONREACTIVE

## 2021-08-13 NOTE — Telephone Encounter (Signed)
HIV test newly reactive, patient information faxed to [facility] for notification and linkage to care.   Sandie Ano, RN
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
## 2021-08-15 LAB — HIV-1/2 AB - DIFFERENTIATION

## 2021-10-04 ENCOUNTER — Emergency Department (HOSPITAL_COMMUNITY): Payer: Self-pay

## 2021-10-04 ENCOUNTER — Encounter (HOSPITAL_COMMUNITY): Payer: Self-pay | Admitting: Emergency Medicine

## 2021-10-04 ENCOUNTER — Emergency Department (HOSPITAL_COMMUNITY)
Admission: EM | Admit: 2021-10-04 | Discharge: 2021-10-04 | Disposition: A | Discharge status: Home / Self Care | Payer: Self-pay | Attending: Emergency Medicine | Admitting: Emergency Medicine

## 2021-10-04 DIAGNOSIS — F17200 Nicotine dependence, unspecified, uncomplicated: Secondary | ICD-10-CM | POA: Insufficient documentation

## 2021-10-04 DIAGNOSIS — M25512 Pain in left shoulder: Secondary | ICD-10-CM | POA: Diagnosis not present

## 2021-10-04 DIAGNOSIS — M25561 Pain in right knee: Secondary | ICD-10-CM | POA: Diagnosis present

## 2021-10-04 DIAGNOSIS — J45909 Unspecified asthma, uncomplicated: Secondary | ICD-10-CM | POA: Diagnosis not present
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
## 2022-01-21 DIAGNOSIS — B20 Human immunodeficiency virus [HIV] disease: Secondary | ICD-10-CM

## 2022-01-21 HISTORY — DX: Human immunodeficiency virus (HIV) disease: B20

## 2023-01-08 ENCOUNTER — Encounter (HOSPITAL_COMMUNITY): Payer: Self-pay | Admitting: Emergency Medicine

## 2023-01-08 ENCOUNTER — Encounter (HOSPITAL_COMMUNITY): Payer: Self-pay | Admitting: *Deleted

## 2023-01-08 ENCOUNTER — Other Ambulatory Visit: Payer: Self-pay

## 2023-01-08 ENCOUNTER — Ambulatory Visit (HOSPITAL_COMMUNITY)
Admission: EM | Admit: 2023-01-08 | Discharge: 2023-01-08 | Disposition: A | Discharge status: Home / Self Care | Payer: Self-pay | Attending: Family Medicine | Admitting: Family Medicine

## 2023-01-08 ENCOUNTER — Emergency Department (HOSPITAL_COMMUNITY)
Admission: EM | Admit: 2023-01-08 | Discharge: 2023-01-09 | Discharge status: Against Medical Advice | Payer: Self-pay | Attending: Emergency Medicine | Admitting: Emergency Medicine

## 2023-01-08 DIAGNOSIS — H5789 Other specified disorders of eye and adnexa: Secondary | ICD-10-CM | POA: Insufficient documentation

## 2023-01-08 DIAGNOSIS — Z5321 Procedure and treatment not carried out due to patient leaving prior to being seen by health care provider: Secondary | ICD-10-CM | POA: Insufficient documentation

## 2023-01-08 DIAGNOSIS — H539 Unspecified visual disturbance: Secondary | ICD-10-CM

## 2023-01-08 DIAGNOSIS — R4189 Other symptoms and signs involving cognitive functions and awareness: Secondary | ICD-10-CM

## 2023-01-08 LAB — COMPREHENSIVE METABOLIC PANEL
ALT: 21 U/L (ref 0–44)
AST: 24 U/L (ref 15–41)
Albumin: 4.6 g/dL (ref 3.5–5.0)
Alkaline Phosphatase: 73 U/L (ref 38–126)
Anion gap: 14 (ref 5–15)
BUN: 12 mg/dL (ref 6–20)
CO2: 22 mmol/L (ref 22–32)
Calcium: 10.1 mg/dL (ref 8.9–10.3)
Chloride: 100 mmol/L (ref 98–111)
Creatinine, Ser: 1.62 mg/dL — ABNORMAL HIGH (ref 0.61–1.24)
GFR, Estimated: 57 mL/min — ABNORMAL LOW (ref >60)
Glucose, Bld: 92 mg/dL (ref 70–99)
Potassium: 4.1 mmol/L (ref 3.5–5.1)
Sodium: 136 mmol/L (ref 135–145)
Total Bilirubin: 1.3 mg/dL — ABNORMAL HIGH (ref <1.2)
Total Protein: 7.8 g/dL (ref 6.5–8.1)

## 2023-01-08 LAB — CBC
HCT: 49.9 % (ref 39.0–52.0)
Hemoglobin: 18.1 g/dL — ABNORMAL HIGH (ref 13.0–17.0)
MCH: 32.9 pg (ref 26.0–34.0)
MCHC: 36.3 g/dL — ABNORMAL HIGH (ref 30.0–36.0)
MCV: 90.7 fL (ref 80.0–100.0)
Platelets: 210 10*3/uL (ref 150–400)
RBC: 5.5 MIL/uL (ref 4.22–5.81)
RDW: 12.3 % (ref 11.5–15.5)
WBC: 5.6 10*3/uL (ref 4.0–10.5)
nRBC: 0 % (ref 0.0–0.2)

## 2023-01-08 LAB — POCT FASTING CBG KUC MANUAL ENTRY: POCT Glucose (KUC): 128 mg/dL — AB (ref 70–99)

## 2023-01-08 NOTE — ED Triage Notes (Addendum)
PT reports he used Percocet and mushrooms last week and just found out he had HIV. Pt reports last he he started to have crazy brain fog. Pt reports he felt crazy.  Pt also reports he can not focus on his telephone.  Pt is having a hard time seeing at night. 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

## 2023-01-08 NOTE — ED Notes (Addendum)
Patient is being discharged from the Urgent Care and sent to the Emergency Department via POV. Per Mardella Layman, MD, patient is in need of higher level of care due to Vision changes and brain fogg. Patient is aware and verbalizes understanding of plan of care.  Vitals:   01/08/23 1419  BP: 116/81  Pulse: 84  Resp: 18  Temp: 98.2 F (36.8 C)  SpO2: 95%

## 2023-01-08 NOTE — ED Triage Notes (Addendum)
Pt reports feeling "brain fog" and "forgetting where he is going"  Also reports vision changes and trouble seeing driving at night. Pt is a [employer].  Pt had reported to urgent care use of mushrooms and recent diagnosis of HIV.

## 2023-01-09 NOTE — ED Notes (Signed)
Called patient 3x for room- no answer. Pulled OTF.
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
## 2023-08-23 ENCOUNTER — Encounter (HOSPITAL_BASED_OUTPATIENT_CLINIC_OR_DEPARTMENT_OTHER): Payer: Self-pay

## 2023-08-23 ENCOUNTER — Emergency Department (HOSPITAL_BASED_OUTPATIENT_CLINIC_OR_DEPARTMENT_OTHER)
Admission: EM | Admit: 2023-08-23 | Discharge: 2023-08-23 | Disposition: A | Discharge status: Home / Self Care | Attending: Emergency Medicine | Admitting: Emergency Medicine

## 2023-08-23 ENCOUNTER — Other Ambulatory Visit: Payer: Self-pay

## 2023-08-23 DIAGNOSIS — L03211 Cellulitis of face: Secondary | ICD-10-CM | POA: Insufficient documentation

## 2023-08-23 DIAGNOSIS — Z21 Asymptomatic human immunodeficiency virus [HIV] infection status: Secondary | ICD-10-CM | POA: Diagnosis not present

## 2023-08-23 DIAGNOSIS — G501 Atypical facial pain: Secondary | ICD-10-CM | POA: Diagnosis present

## 2023-08-23 MED ORDER — CLINDAMYCIN HCL 300 MG PO CAPS
300.0000 mg | ORAL_CAPSULE | Freq: Four times a day (QID) | ORAL | 0 refills | Status: AC
Start: 2023-08-23 — End: ?

## 2023-08-23 MED ORDER — CLINDAMYCIN HCL 300 MG PO CAPS: 300.0000 mg | ORAL_CAPSULE | Freq: Four times a day (QID) | ORAL | 0 refills | Status: DC

## 2023-08-23 MED ORDER — CLINDAMYCIN HCL 150 MG PO CAPS
300.0000 mg | ORAL_CAPSULE | Freq: Once | ORAL | Status: AC
  Administered 2023-08-23: 300 mg via ORAL
  Filled 2023-08-23: qty 2

## 2023-08-23 NOTE — ED Triage Notes (Signed)
 Patient arrived POV from home with complaint of possible wound infection.   Patient reports had a bump on chin that he scratched that now may be possible infected. Reports blood & some puss oozing from site.

## 2023-08-23 NOTE — Discharge Instructions (Signed)
 Begin taking clindamycin  as prescribed.  Apply warm compresses as frequently as possible for the next several days.  Follow-up with primary doctor if not improving in the next few days, and return to the ER if symptoms significantly worsen or change.

## 2023-08-23 NOTE — ED Provider Notes (Signed)
  [place] EMERGENCY DEPARTMENT AT [facility] Provider Note   CSN: [id number] Arrival date & time: 08/23/23  9973     Patient presents with: Wound Infection   Billy Parrish is a 33 y.o. male.   Patient is a 33 year old male with history of HIV disease.  Patient presenting today with complaints of pain and swelling to his chin.  He reports having a sore that he picked 2 weeks ago.  It has been increasingly swollen and today was able to express pus.  It has been more swollen and painful since.  His mother convinced him to come to the ER to be checked out.  No fevers or chills.  No intraoral involvement.       Prior to Admission medications   Not on File    Allergies: Patient has no known allergies.    Review of Systems  All other systems reviewed and are negative.   Updated Vital Signs BP 116/75   Pulse 76   Temp 98.2 F (36.8 C) (Oral)   Resp 16   Ht 5' 9 (1.753 m)   Wt 81.3 kg   SpO2 100%   BMI 26.45 kg/m   Physical Exam Vitals and nursing note reviewed.  Constitutional:      Appearance: Normal appearance.  HENT:     Mouth/Throat:     Comments: There is a swollen, erythematous, draining area noted to the left chin.  There is no palpable fluctuance. Pulmonary:     Effort: Pulmonary effort is normal.  Neurological:     Mental Status: He is alert.     (all labs ordered are listed, but only abnormal results are displayed) Labs Reviewed - No data to display  EKG: None  Radiology: No results found.   Procedures   Medications Ordered in the ED - No data to display                                  Medical Decision Making  Patient presenting with cellulitis to his chin.  This will be treated with clindamycin  and return as needed.     Final diagnoses:  None    ED Discharge Orders     None          Geroldine Berg, MD 08/23/23 [phone]
